# Patient Record
Sex: Female | Born: 1985
Health system: Southern US, Community
[De-identification: ages and names within clinical notes are randomized; demographics above are authoritative.]

## PROBLEM LIST (undated history)

## (undated) DIAGNOSIS — Z803 Family history of malignant neoplasm of breast: Secondary | ICD-10-CM

## (undated) HISTORY — PX: WISDOM TOOTH EXTRACTION: SHX21

## (undated) HISTORY — PX: NO PAST SURGERIES: SHX2092

## (undated) HISTORY — DX: Family history of malignant neoplasm of breast: Z80.3

---

## 2014-06-02 NOTE — L&D Delivery Note (Signed)
Delivery Note Patient presented on 03/26/15 with PROM of clear fluid at 0230. Labor was augmented with pitocin, and she progressed to completion at 1859. She had a prolonged second stage of labor, laboring down in different positions and pushing for over three hours.  At 3:19 AM a viable and healthy female was delivered via Vaginal, Spontaneous Delivery (Presentation: Middle Occiput Anterior).  APGAR: 8, 9; weight pending.   Placenta status: Intact, Spontaneous. Cord: 3 vessels with the following complications: None.  Cord pH: N/A  Anesthesia: Epidural  Episiotomy: None Lacerations: 2nd degree;Perineal Suture Repair: 3.0 vicryl Est. Blood Loss (mL): 125  Mom to postpartum.  Baby to Couplet care / Skin to Skin.  Jamelle HaringHillary M Fitzgerald, MD [facility], PGY-1 03/27/2015, 4:06 AM  OB fellow attestation: Patient is a G1P0000 at 5038w6d who was admitted for PROM. She progressed with augmentation via pitocin. She had a protracted second stage. She pushed for 3 hours with prolonged crowning.  I was gloved and present for delivery in its entirety. After delivery of the infant's head, a shoulder dystocia was identified and alleviated with Mcroberts, Suprapubic pressure and wood screw which were successful.   Complications: none  Lacerations: 2nd degree  EBL: 125  Federico FlakeKimberly Niles Livy Ross, MD 5:23 AM

## 2014-09-07 LAB — OB RESULTS CONSOLE HGB/HCT, BLOOD
HCT: 39 %
HCT: 39 %
Hemoglobin: 12 g/dL
Hemoglobin: 13.4 g/dL
Hemoglobin: 13.4 g/dL

## 2014-09-07 LAB — OB RESULTS CONSOLE ANTIBODY SCREEN: ANTIBODY SCREEN: NEGATIVE

## 2014-09-07 LAB — OB RESULTS CONSOLE RPR
RPR: NONREACTIVE
RPR: NONREACTIVE
RPR: NONREACTIVE
RPR: REACTIVE

## 2014-09-07 LAB — OB RESULTS CONSOLE HIV ANTIBODY (ROUTINE TESTING)
HIV: NONREACTIVE
HIV: NONREACTIVE
HIV: NONREACTIVE

## 2014-09-07 LAB — OB RESULTS CONSOLE PLATELET COUNT: Platelets: 376 10*3/uL

## 2014-09-07 LAB — OB RESULTS CONSOLE HEPATITIS B SURFACE ANTIGEN: HEP B S AG: NEGATIVE

## 2014-09-07 LAB — OB RESULTS CONSOLE ABO/RH: RH Type: POSITIVE

## 2014-09-07 LAB — OB RESULTS CONSOLE RUBELLA ANTIBODY, IGM: RUBELLA: IMMUNE

## 2014-11-27 ENCOUNTER — Ambulatory Visit (INDEPENDENT_AMBULATORY_CARE_PROVIDER_SITE_OTHER): Payer: BC Managed Care – PPO | Admitting: Obstetrics and Gynecology

## 2014-11-27 ENCOUNTER — Encounter: Payer: Self-pay | Admitting: Obstetrics and Gynecology

## 2014-11-27 VITALS — BP 116/79 | HR 97 | Ht 62.0 in | Wt 144.0 lb

## 2014-11-27 DIAGNOSIS — Z8659 Personal history of other mental and behavioral disorders: Secondary | ICD-10-CM

## 2014-11-27 DIAGNOSIS — Z3400 Encounter for supervision of normal first pregnancy, unspecified trimester: Secondary | ICD-10-CM | POA: Insufficient documentation

## 2014-11-27 DIAGNOSIS — Z3402 Encounter for supervision of normal first pregnancy, second trimester: Secondary | ICD-10-CM

## 2014-11-27 NOTE — Progress Notes (Signed)
   Subjective:    Tabitha Rose is a G1P0000 [redacted]w[redacted]d being seen today for her first obstetrical visit. She recently moved to [place] and started her care in [place]. Her obstetrical history is significant for first pregnancy and h/o depression currently not on any medications. Patient does intend to breast feed. Pregnancy history fully reviewed.  Patient reports no complaints.  Filed Vitals:   11/27/14 1450 11/27/14 1452  BP: 116/79   Pulse: 97   Height:  5\' 2"  (1.575 m)  Weight: 144 lb (65.318 kg)     HISTORY: OB History  Gravida Para Term Preterm AB SAB TAB Ectopic Multiple Living  1 0 0 0 0 0 0 0 0 0     # Outcome Date GA Lbr Len/2nd Weight Sex Delivery Anes PTL Lv  1 Current              History reviewed. No pertinent past medical history. Past Surgical History  Procedure Laterality Date  . Wisdom tooth extraction     Family History  Problem Relation Age of Onset  . Breast cancer Mother 25     Exam    Uterus:   24 cm      Assessment:    Pregnancy: G1P0000 Patient Active Problem List   Diagnosis Date Noted  . Supervision of normal first pregnancy, antepartum 11/27/2014  . History of depression 11/27/2014        Plan:     Initial labs drawn. Prenatal vitamins. Problem list reviewed and updated. Genetic Screening discussed First Screen: results reviewed.  Ultrasound discussed; fetal survey: results reviewed. 1 hour GCT and labs today  Follow up in 4 weeks. 50% of 30 min visit spent on counseling and coordination of care.     Kielyn Kardell 11/27/2014

## 2014-11-27 NOTE — Patient Instructions (Signed)
Second Trimester of Pregnancy The second trimester is from week 13 through week 28, months 4 through 6. The second trimester is often a time when you feel your best. Your body has also adjusted to being pregnant, and you begin to feel better physically. Usually, morning sickness has lessened or quit completely, you may have more energy, and you may have an increase in appetite. The second trimester is also a time when the fetus is growing rapidly. At the end of the sixth month, the fetus is about 9 inches long and weighs about 1 pounds. You will likely begin to feel the baby move (quickening) between 18 and 20 weeks of the pregnancy. BODY CHANGES Your body goes through many changes during pregnancy. The changes vary from woman to woman.   Your weight will continue to increase. You will notice your lower abdomen bulging out.  You may begin to get stretch marks on your hips, abdomen, and breasts.  You may develop headaches that can be relieved by medicines approved by your health care provider.  You may urinate more often because the fetus is pressing on your bladder.  You may develop or continue to have heartburn as a result of your pregnancy.  You may develop constipation because certain hormones are causing the muscles that push waste through your intestines to slow down.  You may develop hemorrhoids or swollen, bulging veins (varicose veins).  You may have back pain because of the weight gain and pregnancy hormones relaxing your joints between the bones in your pelvis and as a result of a shift in weight and the muscles that support your balance.  Your breasts will continue to grow and be tender.  Your gums may bleed and may be sensitive to brushing and flossing.  Dark spots or blotches (chloasma, mask of pregnancy) may develop on your face. This will likely fade after the baby is born.  A dark line from your belly button to the pubic area (linea nigra) may appear. This will likely  fade after the baby is born.  You may have changes in your hair. These can include thickening of your hair, rapid growth, and changes in texture. Some women also have hair loss during or after pregnancy, or hair that feels dry or thin. Your hair will most likely return to normal after your baby is born. WHAT TO EXPECT AT YOUR PRENATAL VISITS During a routine prenatal visit:  You will be weighed to make sure you and the fetus are growing normally.  Your blood pressure will be taken.  Your abdomen will be measured to track your baby's growth.  The fetal heartbeat will be listened to.  Any test results from the previous visit will be discussed. Your health care provider may ask you:  How you are feeling.  If you are feeling the baby move.  If you have had any abnormal symptoms, such as leaking fluid, bleeding, severe headaches, or abdominal cramping.  If you have any questions. Other tests that may be performed during your second trimester include:  Blood tests that check for:  Low iron levels (anemia).  Gestational diabetes (between 24 and 28 weeks).  Rh antibodies.  Urine tests to check for infections, diabetes, or protein in the urine.  An ultrasound to confirm the proper growth and development of the baby.  An amniocentesis to check for possible genetic problems.  Fetal screens for spina bifida and Down syndrome. HOME CARE INSTRUCTIONS   Avoid all smoking, herbs, alcohol, and unprescribed   drugs. These chemicals affect the formation and growth of the baby.  Follow your health care provider's instructions regarding medicine use. There are medicines that are either safe or unsafe to take during pregnancy.  Exercise only as directed by your health care provider. Experiencing uterine cramps is a good sign to stop exercising.  Continue to eat regular, healthy meals.  Wear a good support bra for breast tenderness.  Do not use hot tubs, steam rooms, or saunas.  Wear  your seat belt at all times when driving.  Avoid raw meat, uncooked cheese, cat litter boxes, and soil used by cats. These carry germs that can cause birth defects in the baby.  Take your prenatal vitamins.  Try taking a stool softener (if your health care provider approves) if you develop constipation. Eat more high-fiber foods, such as fresh vegetables or fruit and whole grains. Drink plenty of fluids to keep your urine clear or pale yellow.  Take warm sitz baths to soothe any pain or discomfort caused by hemorrhoids. Use hemorrhoid cream if your health care provider approves.  If you develop varicose veins, wear support hose. Elevate your feet for 15 minutes, 3-4 times a day. Limit salt in your diet.  Avoid heavy lifting, wear low heel shoes, and practice good posture.  Rest with your legs elevated if you have leg cramps or low back pain.  Visit your dentist if you have not gone yet during your pregnancy. Use a soft toothbrush to brush your teeth and be gentle when you floss.  A sexual relationship may be continued unless your health care provider directs you otherwise.  Continue to go to all your prenatal visits as directed by your health care provider. SEEK MEDICAL CARE IF:   You have dizziness.  You have mild pelvic cramps, pelvic pressure, or nagging pain in the abdominal area.  You have persistent nausea, vomiting, or diarrhea.  You have a bad smelling vaginal discharge.  You have pain with urination. SEEK IMMEDIATE MEDICAL CARE IF:   You have a fever.  You are leaking fluid from your vagina.  You have spotting or bleeding from your vagina.  You have severe abdominal cramping or pain.  You have rapid weight gain or loss.  You have shortness of breath with chest pain.  You notice sudden or extreme swelling of your face, hands, ankles, feet, or legs.  You have not felt your baby move in over an hour.  You have severe headaches that do not go away with  medicine.  You have vision changes. Document Released: 05/13/2001 Document Revised: 05/24/2013 Document Reviewed: 07/20/2012 [employer] Patient Information 2015 [employer]. This information is not intended to replace advice given to you by your health care provider. Make sure you discuss any questions you have with your health care provider.  Contraception Choices Contraception (birth control) is the use of any methods or devices to prevent pregnancy. Below are some methods to help avoid pregnancy. HORMONAL METHODS   Contraceptive implant. This is a thin, plastic tube containing progesterone hormone. It does not contain estrogen hormone. Your health care provider inserts the tube in the inner part of the upper arm. The tube can remain in place for up to 3 years. After 3 years, the implant must be removed. The implant prevents the ovaries from releasing an egg (ovulation), thickens the cervical mucus to prevent sperm from entering the uterus, and thins the lining of the inside of the uterus.  Progesterone-only injections. These injections are given   every 3 months by your health care provider to prevent pregnancy. This synthetic progesterone hormone stops the ovaries from releasing eggs. It also thickens cervical mucus and changes the uterine lining. This makes it harder for sperm to survive in the uterus.  Birth control pills. These pills contain estrogen and progesterone hormone. They work by preventing the ovaries from releasing eggs (ovulation). They also cause the cervical mucus to thicken, preventing the sperm from entering the uterus. Birth control pills are prescribed by a health care provider.Birth control pills can also be used to treat heavy periods.  Minipill. This type of birth control pill contains only the progesterone hormone. They are taken every day of each month and must be prescribed by your health care provider.  Birth control patch. The patch contains hormones similar to  those in birth control pills. It must be changed once a week and is prescribed by a health care provider.  Vaginal ring. The ring contains hormones similar to those in birth control pills. It is left in the vagina for 3 weeks, removed for 1 week, and then a new one is put back in place. The patient must be comfortable inserting and removing the ring from the vagina.A health care provider's prescription is necessary.  Emergency contraception. Emergency contraceptives prevent pregnancy after unprotected sexual intercourse. This pill can be taken right after sex or up to 5 days after unprotected sex. It is most effective the sooner you take the pills after having sexual intercourse. Most emergency contraceptive pills are available without a prescription. Check with your pharmacist. Do not use emergency contraception as your only form of birth control. BARRIER METHODS   Female condom. This is a thin sheath (latex or rubber) that is worn over the penis during sexual intercourse. It can be used with spermicide to increase effectiveness.  Female condom. This is a soft, loose-fitting sheath that is put into the vagina before sexual intercourse.  Diaphragm. This is a soft, latex, dome-shaped barrier that must be fitted by a health care provider. It is inserted into the vagina, along with a spermicidal jelly. It is inserted before intercourse. The diaphragm should be left in the vagina for 6 to 8 hours after intercourse.  Cervical cap. This is a round, soft, latex or plastic cup that fits over the cervix and must be fitted by a health care provider. The cap can be left in place for up to 48 hours after intercourse.  Sponge. This is a soft, circular piece of polyurethane foam. The sponge has spermicide in it. It is inserted into the vagina after wetting it and before sexual intercourse.  Spermicides. These are chemicals that kill or block sperm from entering the cervix and uterus. They come in the form of  creams, jellies, suppositories, foam, or tablets. They do not require a prescription. They are inserted into the vagina with an applicator before having sexual intercourse. The process must be repeated every time you have sexual intercourse. INTRAUTERINE CONTRACEPTION  Intrauterine device (IUD). This is a T-shaped device that is put in a woman's uterus during a menstrual period to prevent pregnancy. There are 2 types:  Copper IUD. This type of IUD is wrapped in copper wire and is placed inside the uterus. Copper makes the uterus and fallopian tubes produce a fluid that kills sperm. It can stay in place for 10 years.  Hormone IUD. This type of IUD contains the hormone progestin (synthetic progesterone). The hormone thickens the cervical mucus and prevents sperm from   entering the uterus, and it also thins the uterine lining to prevent implantation of a fertilized egg. The hormone can weaken or kill the sperm that get into the uterus. It can stay in place for 3-5 years, depending on which type of IUD is used. PERMANENT METHODS OF CONTRACEPTION  Female tubal ligation. This is when the woman's fallopian tubes are surgically sealed, tied, or blocked to prevent the egg from traveling to the uterus.  Hysteroscopic sterilization. This involves placing a small coil or insert into each fallopian tube. Your doctor uses a technique called hysteroscopy to do the procedure. The device causes scar tissue to form. This results in permanent blockage of the fallopian tubes, so the sperm cannot fertilize the egg. It takes about 3 months after the procedure for the tubes to become blocked. You must use another form of birth control for these 3 months.  Female sterilization. This is when the female has the tubes that carry sperm tied off (vasectomy).This blocks sperm from entering the vagina during sexual intercourse. After the procedure, the man can still ejaculate fluid (semen). NATURAL PLANNING METHODS  Natural family  planning. This is not having sexual intercourse or using a barrier method (condom, diaphragm, cervical cap) on days the woman could become pregnant.  Calendar method. This is keeping track of the length of each menstrual cycle and identifying when you are fertile.  Ovulation method. This is avoiding sexual intercourse during ovulation.  Symptothermal method. This is avoiding sexual intercourse during ovulation, using a thermometer and ovulation symptoms.  Post-ovulation method. This is timing sexual intercourse after you have ovulated. Regardless of which type or method of contraception you choose, it is important that you use condoms to protect against the transmission of sexually transmitted infections (STIs). Talk with your health care provider about which form of contraception is most appropriate for you. Document Released: 05/19/2005 Document Revised: 05/24/2013 Document Reviewed: 11/11/2012 [employer] Patient Information 2015 [employer]. This information is not intended to replace advice given to you by your health care provider. Make sure you discuss any questions you have with your health care provider.  Postpartum Depression and Baby Blues The postpartum period begins right after the birth of a baby. During this time, there is often a great amount of joy and excitement. It is also a time of many changes in the life of the parents. Regardless of how many times a mother gives birth, each child brings new challenges and dynamics to the family. It is not unusual to have feelings of excitement along with confusing shifts in moods, emotions, and thoughts. All mothers are at risk of developing postpartum depression or the "baby blues." These mood changes can occur right after giving birth, or they may occur many months after giving birth. The baby blues or postpartum depression can be mild or severe. Additionally, postpartum depression can go away rather quickly, or it can be a long-term  condition.  CAUSES Raised hormone levels and the rapid drop in those levels are thought to be a main cause of postpartum depression and the baby blues. A number of hormones change during and after pregnancy. Estrogen and progesterone usually decrease right after the delivery of your baby. The levels of thyroid hormone and various cortisol steroids also rapidly drop. Other factors that play a role in these mood changes include major life events and genetics.  RISK FACTORS If you have any of the following risks for the baby blues or postpartum depression, know what symptoms to watch out   for during the postpartum period. Risk factors that may increase the likelihood of getting the baby blues or postpartum depression include:  Having a personal or family history of depression.   Having depression while being pregnant.   Having premenstrual mood issues or mood issues related to oral contraceptives.  Having a lot of life stress.   Having marital conflict.   Lacking a social support network.   Having a baby with special needs.   Having health problems, such as diabetes.  SIGNS AND SYMPTOMS Symptoms of baby blues include:  Brief changes in mood, such as going from extreme happiness to sadness.  Decreased concentration.   Difficulty sleeping.   Crying spells, tearfulness.   Irritability.   Anxiety.  Symptoms of postpartum depression typically begin within the first month after giving birth. These symptoms include:  Difficulty sleeping or excessive sleepiness.   Marked weight loss.   Agitation.   Feelings of worthlessness.   Lack of interest in activity or food.  Postpartum psychosis is a very serious condition and can be dangerous. Fortunately, it is rare. Displaying any of the following symptoms is cause for immediate medical attention. Symptoms of postpartum psychosis include:   Hallucinations and delusions.   Bizarre or disorganized behavior.    Confusion or disorientation.  DIAGNOSIS  A diagnosis is made by an evaluation of your symptoms. There are no medical or lab tests that lead to a diagnosis, but there are various questionnaires that a health care provider may use to identify those with the baby blues, postpartum depression, or psychosis. Often, a screening tool called the Edinburgh Postnatal Depression Scale is used to diagnose depression in the postpartum period.  TREATMENT The baby blues usually goes away on its own in 1-2 weeks. Social support is often all that is needed. You will be encouraged to get adequate sleep and rest. Occasionally, you may be given medicines to help you sleep.  Postpartum depression requires treatment because it can last several months or longer if it is not treated. Treatment may include individual or group therapy, medicine, or both to address any social, physiological, and psychological factors that may play a role in the depression. Regular exercise, a healthy diet, rest, and social support may also be strongly recommended.  Postpartum psychosis is more serious and needs treatment right away. Hospitalization is often needed. HOME CARE INSTRUCTIONS  Get as much rest as you can. Nap when the baby sleeps.   Exercise regularly. Some women find yoga and walking to be beneficial.   Eat a balanced and nourishing diet.   Do little things that you enjoy. Have a cup of tea, take a bubble bath, read your favorite magazine, or listen to your favorite music.  Avoid alcohol.   Ask for help with household chores, cooking, grocery shopping, or running errands as needed. Do not try to do everything.   Talk to people close to you about how you are feeling. Get support from your partner, family members, friends, or other new moms.  Try to stay positive in how you think. Think about the things you are grateful for.   Do not spend a lot of time alone.   Only take over-the-counter or prescription  medicine as directed by your health care provider.  Keep all your postpartum appointments.   Let your health care provider know if you have any concerns.  SEEK MEDICAL CARE IF: You are having a reaction to or problems with your medicine. SEEK IMMEDIATE MEDICAL CARE IF:    You have suicidal feelings.   You think you may harm the baby or someone else. MAKE SURE YOU:  Understand these instructions.  Will watch your condition.  Will get help right away if you are not doing well or get worse. Document Released: 02/21/2004 Document Revised: 05/24/2013 Document Reviewed: 02/28/2013 [employer] Patient Information 2015 [employer]. This information is not intended to replace advice given to you by your health care provider. Make sure you discuss any questions you have with your health care provider.  Breastfeeding Deciding to breastfeed is one of the best choices you can make for you and your baby. A change in hormones during pregnancy causes your breast tissue to grow and increases the number and size of your milk ducts. These hormones also allow proteins, sugars, and fats from your blood supply to make breast milk in your milk-producing glands. Hormones prevent breast milk from being released before your baby is born as well as prompt milk flow after birth. Once breastfeeding has begun, thoughts of your baby, as well as his or her sucking or crying, can stimulate the release of milk from your milk-producing glands.  BENEFITS OF BREASTFEEDING For Your Baby  Your first milk (colostrum) helps your baby's digestive system function better.   There are antibodies in your milk that help your baby fight off infections.   Your baby has a lower incidence of asthma, allergies, and sudden infant death syndrome.   The nutrients in breast milk are better for your baby than infant formulas and are designed uniquely for your baby's needs.   Breast milk improves your baby's brain development.    Your baby is less likely to develop other conditions, such as childhood obesity, asthma, or type 2 diabetes mellitus.  For You   Breastfeeding helps to create a very special bond between you and your baby.   Breastfeeding is convenient. Breast milk is always available at the correct temperature and costs nothing.   Breastfeeding helps to burn calories and helps you lose the weight gained during pregnancy.   Breastfeeding makes your uterus contract to its prepregnancy size faster and slows bleeding (lochia) after you give birth.   Breastfeeding helps to lower your risk of developing type 2 diabetes mellitus, osteoporosis, and breast or ovarian cancer later in life. SIGNS THAT YOUR BABY IS HUNGRY Early Signs of Hunger  Increased alertness or activity.  Stretching.  Movement of the head from side to side.  Movement of the head and opening of the mouth when the corner of the mouth or cheek is stroked (rooting).  Increased sucking sounds, smacking lips, cooing, sighing, or squeaking.  Hand-to-mouth movements.  Increased sucking of fingers or hands. Late Signs of Hunger  Fussing.  Intermittent crying. Extreme Signs of Hunger Signs of extreme hunger will require calming and consoling before your baby will be able to breastfeed successfully. Do not wait for the following signs of extreme hunger to occur before you initiate breastfeeding:   Restlessness.  A loud, strong cry.   Screaming. BREASTFEEDING BASICS Breastfeeding Initiation  Find a comfortable place to sit or lie down, with your neck and back well supported.  Place a pillow or rolled up blanket under your baby to bring him or her to the level of your breast (if you are seated). Nursing pillows are specially designed to help support your arms and your baby while you breastfeed.  Make sure that your baby's abdomen is facing your abdomen.   Gently massage your breast. With your   fingertips, massage from your  chest wall toward your nipple in a circular motion. This encourages milk flow. You may need to continue this action during the feeding if your milk flows slowly.  Support your breast with 4 fingers underneath and your thumb above your nipple. Make sure your fingers are well away from your nipple and your baby's mouth.   Stroke your baby's lips gently with your finger or nipple.   When your baby's mouth is open wide enough, quickly bring your baby to your breast, placing your entire nipple and as much of the colored area around your nipple (areola) as possible into your baby's mouth.   More areola should be visible above your baby's upper lip than below the lower lip.   Your baby's tongue should be between his or her lower gum and your breast.   Ensure that your baby's mouth is correctly positioned around your nipple (latched). Your baby's lips should create a seal on your breast and be turned out (everted).  It is common for your baby to suck about 2-3 minutes in order to start the flow of breast milk. Latching Teaching your baby how to latch on to your breast properly is very important. An improper latch can cause nipple pain and decreased milk supply for you and poor weight gain in your baby. Also, if your baby is not latched onto your nipple properly, he or she may swallow some air during feeding. This can make your baby fussy. Burping your baby when you switch breasts during the feeding can help to get rid of the air. However, teaching your baby to latch on properly is still the best way to prevent fussiness from swallowing air while breastfeeding. Signs that your baby has successfully latched on to your nipple:    Silent tugging or silent sucking, without causing you pain.   Swallowing heard between every 3-4 sucks.    Muscle movement above and in front of his or her ears while sucking.  Signs that your baby has not successfully latched on to nipple:   Sucking sounds or  smacking sounds from your baby while breastfeeding.  Nipple pain. If you think your baby has not latched on correctly, slip your finger into the corner of your baby's mouth to break the suction and place it between your baby's gums. Attempt breastfeeding initiation again. Signs of Successful Breastfeeding Signs from your baby:   A gradual decrease in the number of sucks or complete cessation of sucking.   Falling asleep.   Relaxation of his or her body.   Retention of a small amount of milk in his or her mouth.   Letting go of your breast by himself or herself. Signs from you:  Breasts that have increased in firmness, weight, and size 1-3 hours after feeding.   Breasts that are softer immediately after breastfeeding.  Increased milk volume, as well as a change in milk consistency and color by the fifth day of breastfeeding.   Nipples that are not sore, cracked, or bleeding. Signs That Your Baby is Getting Enough Milk  Wetting at least 3 diapers in a 24-hour period. The urine should be clear and pale yellow by age 5 days.  At least 3 stools in a 24-hour period by age 5 days. The stool should be soft and yellow.  At least 3 stools in a 24-hour period by age 7 days. The stool should be seedy and yellow.  No loss of weight greater than 10% of birth   weight during the first 3 days of age.  Average weight gain of 4-7 ounces (113-198 g) per week after age 4 days.  Consistent daily weight gain by age 5 days, without weight loss after the age of 2 weeks. After a feeding, your baby may spit up a small amount. This is common. BREASTFEEDING FREQUENCY AND DURATION Frequent feeding will help you make more milk and can prevent sore nipples and breast engorgement. Breastfeed when you feel the need to reduce the fullness of your breasts or when your baby shows signs of hunger. This is called "breastfeeding on demand." Avoid introducing a pacifier to your baby while you are working to  establish breastfeeding (the first 4-6 weeks after your baby is born). After this time you may choose to use a pacifier. Research has shown that pacifier use during the first year of a baby's life decreases the risk of sudden infant death syndrome (SIDS). Allow your baby to feed on each breast as long as he or she wants. Breastfeed until your baby is finished feeding. When your baby unlatches or falls asleep while feeding from the first breast, offer the second breast. Because newborns are often sleepy in the first few weeks of life, you may need to awaken your baby to get him or her to feed. Breastfeeding times will vary from baby to baby. However, the following rules can serve as a guide to help you ensure that your baby is properly fed:  Newborns (babies 4 weeks of age or younger) may breastfeed every 1-3 hours.  Newborns should not go longer than 3 hours during the day or 5 hours during the night without breastfeeding.  You should breastfeed your baby a minimum of 8 times in a 24-hour period until you begin to introduce solid foods to your baby at around 6 months of age. BREAST MILK PUMPING Pumping and storing breast milk allows you to ensure that your baby is exclusively fed your breast milk, even at times when you are unable to breastfeed. This is especially important if you are going back to work while you are still breastfeeding or when you are not able to be present during feedings. Your lactation consultant can give you guidelines on how long it is safe to store breast milk.  A breast pump is a machine that allows you to pump milk from your breast into a sterile bottle. The pumped breast milk can then be stored in a refrigerator or freezer. Some breast pumps are operated by hand, while others use electricity. Ask your lactation consultant which type will work best for you. Breast pumps can be purchased, but some hospitals and breastfeeding support groups lease breast pumps on a monthly basis. A  lactation consultant can teach you how to hand express breast milk, if you prefer not to use a pump.  CARING FOR YOUR BREASTS WHILE YOU BREASTFEED Nipples can become dry, cracked, and sore while breastfeeding. The following recommendations can help keep your breasts moisturized and healthy:  Avoid using soap on your nipples.   Wear a supportive bra. Although not required, special nursing bras and tank tops are designed to allow access to your breasts for breastfeeding without taking off your entire bra or top. Avoid wearing underwire-style bras or extremely tight bras.  Air dry your nipples for 3-4minutes after each feeding.   Use only cotton bra pads to absorb leaked breast milk. Leaking of breast milk between feedings is normal.   Use lanolin on your nipples after breastfeeding.   Lanolin helps to maintain your skin's normal moisture barrier. If you use pure lanolin, you do not need to wash it off before feeding your baby again. Pure lanolin is not toxic to your baby. You may also hand express a few drops of breast milk and gently massage that milk into your nipples and allow the milk to air dry. In the first few weeks after giving birth, some women experience extremely full breasts (engorgement). Engorgement can make your breasts feel heavy, warm, and tender to the touch. Engorgement peaks within 3-5 days after you give birth. The following recommendations can help ease engorgement:  Completely empty your breasts while breastfeeding or pumping. You may want to start by applying warm, moist heat (in the shower or with warm water-soaked hand towels) just before feeding or pumping. This increases circulation and helps the milk flow. If your baby does not completely empty your breasts while breastfeeding, pump any extra milk after he or she is finished.  Wear a snug bra (nursing or regular) or tank top for 1-2 days to signal your body to slightly decrease milk production.  Apply ice packs to your  breasts, unless this is too uncomfortable for you.  Make sure that your baby is latched on and positioned properly while breastfeeding. If engorgement persists after 48 hours of following these recommendations, contact your health care provider or a lactation consultant. OVERALL HEALTH CARE RECOMMENDATIONS WHILE BREASTFEEDING  Eat healthy foods. Alternate between meals and snacks, eating 3 of each per day. Because what you eat affects your breast milk, some of the foods may make your baby more irritable than usual. Avoid eating these foods if you are sure that they are negatively affecting your baby.  Drink milk, fruit juice, and water to satisfy your thirst (about 10 glasses a day).   Rest often, relax, and continue to take your prenatal vitamins to prevent fatigue, stress, and anemia.  Continue breast self-awareness checks.  Avoid chewing and smoking tobacco.  Avoid alcohol and drug use. Some medicines that may be harmful to your baby can pass through breast milk. It is important to ask your health care provider before taking any medicine, including all over-the-counter and prescription medicine as well as vitamin and herbal supplements. It is possible to become pregnant while breastfeeding. If birth control is desired, ask your health care provider about options that will be safe for your baby. SEEK MEDICAL CARE IF:   You feel like you want to stop breastfeeding or have become frustrated with breastfeeding.  You have painful breasts or nipples.  Your nipples are cracked or bleeding.  Your breasts are red, tender, or warm.  You have a swollen area on either breast.  You have a fever or chills.  You have nausea or vomiting.  You have drainage other than breast milk from your nipples.  Your breasts do not become full before feedings by the fifth day after you give birth.  You feel sad and depressed.  Your baby is too sleepy to eat well.  Your baby is having trouble  sleeping.   Your baby is wetting less than 3 diapers in a 24-hour period.  Your baby has less than 3 stools in a 24-hour period.  Your baby's skin or the white part of his or her eyes becomes yellow.   Your baby is not gaining weight by 5 days of age. SEEK IMMEDIATE MEDICAL CARE IF:   Your baby is overly tired (lethargic) and does not want to wake   up and feed.  Your baby develops an unexplained fever. Document Released: 05/19/2005 Document Revised: 05/24/2013 Document Reviewed: 11/10/2012 [employer] Patient Information 2015 [employer]. This information is not intended to replace advice given to you by your health care provider. Make sure you discuss any questions you have with your health care provider.  

## 2014-12-25 ENCOUNTER — Other Ambulatory Visit: Payer: Self-pay | Admitting: Obstetrics & Gynecology

## 2014-12-25 ENCOUNTER — Ambulatory Visit (INDEPENDENT_AMBULATORY_CARE_PROVIDER_SITE_OTHER): Payer: BC Managed Care – PPO | Admitting: Obstetrics & Gynecology

## 2014-12-25 ENCOUNTER — Encounter: Payer: Self-pay | Admitting: *Deleted

## 2014-12-25 VITALS — BP 113/72 | HR 109 | Wt 150.0 lb

## 2014-12-25 DIAGNOSIS — Z36 Encounter for antenatal screening of mother: Secondary | ICD-10-CM | POA: Diagnosis not present

## 2014-12-25 DIAGNOSIS — Z3403 Encounter for supervision of normal first pregnancy, third trimester: Secondary | ICD-10-CM

## 2014-12-25 DIAGNOSIS — Z23 Encounter for immunization: Secondary | ICD-10-CM | POA: Diagnosis not present

## 2014-12-25 LAB — CBC
HCT: 34.3 % — ABNORMAL LOW (ref 36.0–46.0)
HEMOGLOBIN: 12 g/dL (ref 12.0–15.0)
MCH: 31.1 pg (ref 26.0–34.0)
MCHC: 35 g/dL (ref 30.0–36.0)
MCV: 88.9 fL (ref 78.0–100.0)
MPV: 8.6 fL (ref 8.6–12.4)
Platelets: 303 10*3/uL (ref 150–400)
RBC: 3.86 MIL/uL — AB (ref 3.87–5.11)
RDW: 13.2 % (ref 11.5–15.5)
WBC: 9.9 10*3/uL (ref 4.0–10.5)

## 2014-12-25 NOTE — Patient Instructions (Signed)
Return to clinic for any obstetric concerns or go to [facility] for evaluation  

## 2014-12-25 NOTE — Progress Notes (Signed)
Subjective:  Tabitha Rose is a 29 y.o. G1P0000 at [redacted]w[redacted]d being seen today for ongoing prenatal care.  Patient reports no complaints.  Contractions: Not present.  Vag. Bleeding: None. Movement: Present. Denies leaking of fluid.   The following portions of the patient's history were reviewed and updated as appropriate: allergies, current medications, past family history, past medical history, past social history, past surgical history and problem list.   Objective:   Filed Vitals:   12/25/14 1346  BP: 113/72  Pulse: 109  Weight: 150 lb (68.04 kg)    Fetal Status: Fetal Heart Rate (bpm): 135 Fundal Height: 27 cm Movement: Present     General:  Alert, oriented and cooperative. Patient is in no acute distress.  Skin: Skin is warm and dry. No rash noted.   Cardiovascular: Normal heart rate noted  Respiratory: Normal respiratory effort, no problems with respiration noted  Abdomen: Soft, gravid, appropriate for gestational age. Pain/Pressure: Absent     Vaginal: Vag. Bleeding: None.    Vag D/C Character: Thin  Cervix: Not evaluated        Extremities: Normal range of motion.  Edema: None  Mental Status: Normal mood and affect. Normal behavior. Normal judgment and thought content.   Urinalysis: Urine Protein: Negative Urine Glucose: Negative  Assessment and Plan:  Pregnancy: G1P0000 at [redacted]w[redacted]d  1. Supervision of normal first pregnancy, antepartum, third trimester Third trimester labs, Tdap today; will follow up results and manage accordingly. - Glucose Tolerance, 1 HR (50g) - CBC - RPR - HIV antibody - Tdap vaccine greater than or equal to 7yo IM - ABO Preterm labor symptoms and general obstetric precautions including but not limited to vaginal bleeding, contractions, leaking of fluid and fetal movement were reviewed in detail with the patient. Please refer to After Visit Summary for other counseling recommendations.  No Follow-up on file.   Tereso Newcomer, MD

## 2014-12-26 ENCOUNTER — Telehealth: Payer: Self-pay | Admitting: *Deleted

## 2014-12-26 LAB — HIV ANTIBODY (ROUTINE TESTING W REFLEX): HIV: NONREACTIVE

## 2014-12-26 LAB — GLUCOSE TOLERANCE, 1 HOUR (50G) W/O FASTING: GLUCOSE 1 HOUR GTT: 156 mg/dL — AB (ref 70–140)

## 2014-12-26 LAB — RPR

## 2014-12-26 LAB — ABO

## 2014-12-26 NOTE — Telephone Encounter (Signed)
-----   Message from Tereso Newcomer, MD sent at 12/26/2014 12:10 PM EDT ----- 1 hr GTT 156.  Please schedule for 3 hr GTT

## 2014-12-26 NOTE — Telephone Encounter (Signed)
Pt needs a 3HrGTT - LMOM for pt to rtn call to schedule.

## 2014-12-28 LAB — RH TYPE: RH TYPE: POSITIVE

## 2015-01-01 ENCOUNTER — Other Ambulatory Visit (INDEPENDENT_AMBULATORY_CARE_PROVIDER_SITE_OTHER): Payer: BC Managed Care – PPO | Admitting: *Deleted

## 2015-01-01 DIAGNOSIS — Z131 Encounter for screening for diabetes mellitus: Secondary | ICD-10-CM

## 2015-01-01 DIAGNOSIS — R7309 Other abnormal glucose: Secondary | ICD-10-CM

## 2015-01-01 DIAGNOSIS — R7302 Impaired glucose tolerance (oral): Secondary | ICD-10-CM

## 2015-01-01 NOTE — Progress Notes (Signed)
Patient here today for a 3 hr GTT.  

## 2015-01-02 LAB — GLUCOSE TOLERANCE, 3 HOURS
Glucose Tolerance, 1 hour: 131 mg/dL (ref 70–189)
Glucose Tolerance, 2 hour: 124 mg/dL (ref 70–164)
Glucose Tolerance, Fasting: 47 mg/dL — ABNORMAL LOW (ref 65–99)
Glucose, GTT - 3 Hour: 155 mg/dL — ABNORMAL HIGH (ref 70–144)

## 2015-01-09 ENCOUNTER — Encounter: Payer: Self-pay | Admitting: Obstetrics & Gynecology

## 2015-01-09 ENCOUNTER — Ambulatory Visit (INDEPENDENT_AMBULATORY_CARE_PROVIDER_SITE_OTHER): Payer: BC Managed Care – PPO | Admitting: Obstetrics & Gynecology

## 2015-01-09 VITALS — BP 100/67 | HR 105 | Wt 151.0 lb

## 2015-01-09 DIAGNOSIS — Z3403 Encounter for supervision of normal first pregnancy, third trimester: Secondary | ICD-10-CM

## 2015-01-09 MED ORDER — DOXYLAMINE-PYRIDOXINE 10-10 MG PO TBEC: 1.0000 | DELAYED_RELEASE_TABLET | Freq: Two times a day (BID) | ORAL | Status: DC

## 2015-01-09 NOTE — Progress Notes (Signed)
Pt c/o sudden sharp lower pains in her lower back on occasion, believes it could be related to sciatic nerve pain, needs refill on Diclegis for nausea.

## 2015-01-09 NOTE — Progress Notes (Signed)
Subjective:  Tabitha Rose is a 29 y.o. G1P0000 at [redacted]w[redacted]d being seen today for ongoing prenatal care.  Patient reports backache.  Contractions: Not present.  Vag. Bleeding: None. Movement: Present. Denies leaking of fluid.   The following portions of the patient's history were reviewed and updated as appropriate: allergies, current medications, past family history, past medical history, past social history, past surgical history and problem list.   Objective:   Filed Vitals:   01/09/15 1042  BP: 100/67  Pulse: 105  Weight: 151 lb (68.493 kg)    Fetal Status: Fetal Heart Rate (bpm): 146   Movement: Present     General:  Alert, oriented and cooperative. Patient is in no acute distress.  Skin: Skin is warm and dry. No rash noted.   Cardiovascular: Normal heart rate noted  Respiratory: Normal respiratory effort, no problems with respiration noted  Abdomen: Soft, gravid, appropriate for gestational age. Pain/Pressure: Absent     Pelvic: Vag. Bleeding: None Vag D/C Character: Thin   Cervical exam deferred        Extremities: Normal range of motion.  Edema: None  Mental Status: Normal mood and affect. Normal behavior. Normal judgment and thought content.   Urinalysis:      Assessment and Plan:  Pregnancy: G1P0000 at [redacted]w[redacted]d  1. Supervision of normal first pregnancy, antepartum, third trimester  - US OB Follow Up; Future for size greater than dates  Preterm labor symptoms and general obstetric precautions including but not limited to vaginal bleeding, contractions, leaking of fluid and fetal movement were reviewed in detail with the patient.  Please refer to After Visit Summary for other counseling recommendations.  Return in about 2 weeks (around 01/23/2015).   Allie Bossier, MD

## 2015-01-23 ENCOUNTER — Ambulatory Visit (HOSPITAL_COMMUNITY): Admission: RE | Admit: 2015-01-23 | Payer: BC Managed Care – PPO | Source: Ambulatory Visit

## 2015-01-23 ENCOUNTER — Ambulatory Visit (INDEPENDENT_AMBULATORY_CARE_PROVIDER_SITE_OTHER): Payer: BC Managed Care – PPO | Admitting: Obstetrics & Gynecology

## 2015-01-23 VITALS — BP 118/74 | HR 105 | Wt 154.0 lb

## 2015-01-23 DIAGNOSIS — R300 Dysuria: Secondary | ICD-10-CM | POA: Diagnosis not present

## 2015-01-23 DIAGNOSIS — O26893 Other specified pregnancy related conditions, third trimester: Secondary | ICD-10-CM

## 2015-01-23 DIAGNOSIS — Z3403 Encounter for supervision of normal first pregnancy, third trimester: Secondary | ICD-10-CM

## 2015-01-23 LAB — POCT URINALYSIS DIPSTICK
Bilirubin, UA: NEGATIVE
Blood, UA: NEGATIVE
GLUCOSE UA: NEGATIVE
KETONES UA: NEGATIVE
Leukocytes, UA: NEGATIVE
Nitrite, UA: NEGATIVE
PROTEIN UA: NEGATIVE
Spec Grav, UA: 1.01
Urobilinogen, UA: NEGATIVE
pH, UA: 5

## 2015-01-23 NOTE — Progress Notes (Signed)
Has a pressure around her urethra associated with baby's movement.  Has had pain with intercourse.

## 2015-01-23 NOTE — Progress Notes (Signed)
Subjective:  Tabitha Rose is a 29 y.o. G1P0000 at [redacted]w[redacted]d being seen today for ongoing prenatal care.  Patient reports some "urethral"pain with fetal movement..  Contractions: Not present.  Vag. Bleeding: None. Movement: Present. Denies leaking of fluid.   The following portions of the patient's history were reviewed and updated as appropriate: allergies, current medications, past family history, past medical history, past social history, past surgical history and problem list.   Objective:   Filed Vitals:   01/23/15 1022  BP: 118/74  Pulse: 105  Weight: 154 lb (69.854 kg)    Fetal Status: Fetal Heart Rate (bpm): 152   Movement: Present     General:  Alert, oriented and cooperative. Patient is in no acute distress.  Skin: Skin is warm and dry. No rash noted.   Cardiovascular: Normal heart rate noted  Respiratory: Normal respiratory effort, no problems with respiration noted  Abdomen: Soft, gravid, appropriate for gestational age. Pain/Pressure: Absent     Pelvic: Vag. Bleeding: None Vag D/C Character: Thin   Cervical exam deferred       Her urethra and EG all look normal.  Extremities: Normal range of motion.  Edema: None  Mental Status: Normal mood and affect. Normal behavior. Normal judgment and thought content.   Urinalysis: Urine Protein: Negative Urine Glucose: Negative  Assessment and Plan:  Pregnancy: G1P0000 at [redacted]w[redacted]d  1. Supervision of normal first pregnancy, antepartum, third trimester   2. Dysuria during pregnancy in third trimester  - POCT Urinalysis Dipstick  Preterm labor symptoms and general obstetric precautions including but not limited to vaginal bleeding, contractions, leaking of fluid and fetal movement were reviewed in detail with the patient.  Please refer to After Visit Summary for other counseling recommendations.  No Follow-up on file.   Allie Bossier, MD

## 2015-02-15 ENCOUNTER — Ambulatory Visit (INDEPENDENT_AMBULATORY_CARE_PROVIDER_SITE_OTHER): Payer: BC Managed Care – PPO | Admitting: Obstetrics & Gynecology

## 2015-02-15 VITALS — BP 120/75 | HR 99 | Wt 163.0 lb

## 2015-02-15 DIAGNOSIS — Z23 Encounter for immunization: Secondary | ICD-10-CM

## 2015-02-15 DIAGNOSIS — Z3403 Encounter for supervision of normal first pregnancy, third trimester: Secondary | ICD-10-CM

## 2015-02-15 NOTE — Progress Notes (Signed)
Subjective:  Tabitha Rose is a 29 y.o. G1P0000 at [redacted]w[redacted]d being seen today for ongoing prenatal care.  Patient reports no complaints.  Contractions: Irregular.  Vag. Bleeding: None. Movement: Present. Denies leaking of fluid.   The following portions of the patient's history were reviewed and updated as appropriate: allergies, current medications, past family history, past medical history, past social history, past surgical history and problem list.   Objective:   Filed Vitals:   02/15/15 0949  BP: 120/75  Pulse: 99  Weight: 163 lb (73.936 kg)    Fetal Status: Fetal Heart Rate (bpm): 134 Fundal Height: 37 cm Movement: Present     General:  Alert, oriented and cooperative. Patient is in no acute distress.  Skin: Skin is warm and dry. No rash noted.   Cardiovascular: Normal heart rate noted  Respiratory: Normal respiratory effort, no problems with respiration noted  Abdomen: Soft, gravid, appropriate for gestational age. Pain/Pressure: Present     Pelvic: Vag. Bleeding: None Vag D/C Character: Thin   Cervical exam deferred        Extremities: Normal range of motion.  Edema: Trace  Mental Status: Normal mood and affect. Normal behavior. Normal judgment and thought content.   Urinalysis: Urine Protein: Negative Urine Glucose: Negative  Assessment and Plan:  Pregnancy: G1P0000 at [redacted]w[redacted]d  1. Supervision of normal first pregnancy, antepartum, third trimester - Flu vaccine today - Cervical cultures at next visit  Preterm labor symptoms and general obstetric precautions including but not limited to vaginal bleeding, contractions, leaking of fluid and fetal movement were reviewed in detail with the patient. Please refer to After Visit Summary for other counseling recommendations.  Return in about 1 week (around 02/22/2015) for cervical cultures.   Allie Bossier, MD

## 2015-02-23 ENCOUNTER — Ambulatory Visit (INDEPENDENT_AMBULATORY_CARE_PROVIDER_SITE_OTHER): Payer: BC Managed Care – PPO | Admitting: Obstetrics & Gynecology

## 2015-02-23 VITALS — BP 113/75 | HR 82 | Wt 168.0 lb

## 2015-02-23 DIAGNOSIS — Z36 Encounter for antenatal screening of mother: Secondary | ICD-10-CM | POA: Diagnosis not present

## 2015-02-23 DIAGNOSIS — Z3403 Encounter for supervision of normal first pregnancy, third trimester: Secondary | ICD-10-CM

## 2015-02-23 LAB — OB RESULTS CONSOLE GC/CHLAMYDIA
CHLAMYDIA, DNA PROBE: NEGATIVE
GC PROBE AMP, GENITAL: NEGATIVE

## 2015-02-23 LAB — OB RESULTS CONSOLE GBS: GBS: POSITIVE

## 2015-02-23 NOTE — Patient Instructions (Signed)
Return to clinic for any obstetric concerns or go to [facility] for evaluation  

## 2015-02-23 NOTE — Progress Notes (Signed)
Subjective:  Tabitha Rose is a 29 y.o. G1P0000 at [redacted]w[redacted]d being seen today for ongoing prenatal care.  Patient reports no complaints.  Contractions: Irregular.  Vag. Bleeding: None. Movement: Present. Denies leaking of fluid.   The following portions of the patient's history were reviewed and updated as appropriate: allergies, current medications, past family history, past medical history, past social history, past surgical history and problem list.   Objective:   Filed Vitals:   02/23/15 1003  BP: 113/75  Pulse: 82  Weight: 168 lb (76.204 kg)    Fetal Status: Fetal Heart Rate (bpm): 150 Fundal Height: 37 cm Movement: Present  Presentation: Vertex  General:  Alert, oriented and cooperative. Patient is in no acute distress.  Skin: Skin is warm and dry. No rash noted.   Cardiovascular: Normal heart rate noted  Respiratory: Normal respiratory effort, no problems with respiration noted  Abdomen: Soft, gravid, appropriate for gestational age. Pain/Pressure: Present     Pelvic: Vag. Bleeding: None Vag D/C Character: Thin   Cervical exam performed Dilation: Closed Effacement (%): Thick Station: Ballotable  Extremities: Normal range of motion.  Edema: Trace  Mental Status: Normal mood and affect. Normal behavior. Normal judgment and thought content.   Urinalysis: Urine Protein: Trace Urine Glucose: Negative  Assessment and Plan:  Pregnancy: G1P0000 at [redacted]w[redacted]d  Supervision of normal first pregnancy, antepartum, third trimester Pelvic cultures done today - Culture, beta strep (group b only) - GC/Chlamydia Probe Amp Preterm labor symptoms and general obstetric precautions including but not limited to vaginal bleeding, contractions, leaking of fluid and fetal movement were reviewed in detail with the patient. Please refer to After Visit Summary for other counseling recommendations.  Return in about 1 week (around 03/02/2015) for OB Visit.   Tereso Newcomer, MD

## 2015-02-24 LAB — GC/CHLAMYDIA PROBE AMP
CT PROBE, AMP APTIMA: NEGATIVE
GC Probe RNA: NEGATIVE

## 2015-02-25 LAB — CULTURE, BETA STREP (GROUP B ONLY)

## 2015-03-02 ENCOUNTER — Ambulatory Visit (INDEPENDENT_AMBULATORY_CARE_PROVIDER_SITE_OTHER): Payer: BC Managed Care – PPO | Admitting: Obstetrics & Gynecology

## 2015-03-02 ENCOUNTER — Encounter: Payer: BC Managed Care – PPO | Admitting: Obstetrics & Gynecology

## 2015-03-02 VITALS — BP 122/79 | HR 88 | Wt 171.4 lb

## 2015-03-02 DIAGNOSIS — Z3403 Encounter for supervision of normal first pregnancy, third trimester: Secondary | ICD-10-CM

## 2015-03-02 NOTE — Progress Notes (Signed)
Subjective:  Tabitha Rose is a 29 y.o. G1P0000 at [redacted]w[redacted]d being seen today for ongoing prenatal care.  Patient reports no complaints.   .   .  . Denies leaking of fluid.   The following portions of the patient's history were reviewed and updated as appropriate: allergies, current medications, past family history, past medical history, past social history, past surgical history and problem list.   Objective:   Filed Vitals:   03/02/15 0946  BP: 122/79  Pulse: 88  Weight: 171 lb 6.4 oz (77.747 kg)    Fetal Status: Fetal Heart Rate (bpm): 154 Fundal Height: 38 cm    Presentation: Vertex  General:  Alert, oriented and cooperative. Patient is in no acute distress.  Skin: Skin is warm and dry. No rash noted.   Cardiovascular: Normal heart rate noted  Respiratory: Normal respiratory effort, no problems with respiration noted  Abdomen: Soft, gravid, appropriate for gestational age.       Pelvic:       Cervical exam deferred        Extremities: Normal range of motion.     Mental Status: Normal mood and affect. Normal behavior. Normal judgment and thought content.   Urinalysis:      Assessment and Plan:  Pregnancy: G1P0000 at [redacted]w[redacted]d  1. Supervision of normal first pregnancy, antepartum, third trimester   Term labor symptoms and general obstetric precautions including but not limited to vaginal bleeding, contractions, leaking of fluid and fetal movement were reviewed in detail with the patient. Please refer to After Visit Summary for other counseling recommendations.  Return in about 1 week (around 03/09/2015).   Allie Bossier, MD

## 2015-03-09 ENCOUNTER — Ambulatory Visit (INDEPENDENT_AMBULATORY_CARE_PROVIDER_SITE_OTHER): Payer: BC Managed Care – PPO | Admitting: Family Medicine

## 2015-03-09 VITALS — BP 138/85 | HR 93 | Wt 174.0 lb

## 2015-03-09 DIAGNOSIS — Z3403 Encounter for supervision of normal first pregnancy, third trimester: Secondary | ICD-10-CM

## 2015-03-09 NOTE — Progress Notes (Signed)
Pt c/o a few Braxton-Hicks contractions.  Declines headaches or visual changes.

## 2015-03-09 NOTE — Patient Instructions (Signed)
Third Trimester of Pregnancy The third trimester is from week 29 through week 42, months 7 through 9. The third trimester is a time when the fetus is growing rapidly. At the end of the ninth month, the fetus is about 20 inches in length and weighs 6-10 pounds.  BODY CHANGES Your body goes through many changes during pregnancy. The changes vary from woman to woman.   Your weight will continue to increase. You can expect to gain 25-35 pounds (11-16 kg) by the end of the pregnancy.  You may begin to get stretch marks on your hips, abdomen, and breasts.  You may urinate more often because the fetus is moving lower into your pelvis and pressing on your bladder.  You may develop or continue to have heartburn as a result of your pregnancy.  You may develop constipation because certain hormones are causing the muscles that push waste through your intestines to slow down.  You may develop hemorrhoids or swollen, bulging veins (varicose veins).  You may have pelvic pain because of the weight gain and pregnancy hormones relaxing your joints between the bones in your pelvis. Backaches may result from overexertion of the muscles supporting your posture.  You may have changes in your hair. These can include thickening of your hair, rapid growth, and changes in texture. Some women also have hair loss during or after pregnancy, or hair that feels dry or thin. Your hair will most likely return to normal after your baby is born.  Your breasts will continue to grow and be tender. A yellow discharge may leak from your breasts called colostrum.  Your belly button may stick out.  You may feel short of breath because of your expanding uterus.  You may notice the fetus "dropping," or moving lower in your abdomen.  You may have a bloody mucus discharge. This usually occurs a few days to a week before labor begins.  Your cervix becomes thin and soft (effaced) near your due date. WHAT TO EXPECT AT YOUR  PRENATAL EXAMS  You will have prenatal exams every 2 weeks until week 36. Then, you will have weekly prenatal exams. During a routine prenatal visit:  You will be weighed to make sure you and the fetus are growing normally.  Your blood pressure is taken.  Your abdomen will be measured to track your baby's growth.  The fetal heartbeat will be listened to.  Any test results from the previous visit will be discussed.  You may have a cervical check near your due date to see if you have effaced. At around 36 weeks, your caregiver will check your cervix. At the same time, your caregiver will also perform a test on the secretions of the vaginal tissue. This test is to determine if a type of bacteria, Group B streptococcus, is present. Your caregiver will explain this further. Your caregiver may ask you:  What your birth plan is.  How you are feeling.  If you are feeling the baby move.  If you have had any abnormal symptoms, such as leaking fluid, bleeding, severe headaches, or abdominal cramping.  If you are using any tobacco products, including cigarettes, chewing tobacco, and electronic cigarettes.  If you have any questions. Other tests or screenings that may be performed during your third trimester include:  Blood tests that check for low iron levels (anemia).  Fetal testing to check the health, activity level, and growth of the fetus. Testing is done if you have certain medical conditions or if   there are problems during the pregnancy.  HIV (human immunodeficiency virus) testing. If you are at high risk, you may be screened for HIV during your third trimester of pregnancy. FALSE LABOR You may feel small, irregular contractions that eventually go away. These are called Braxton Hicks contractions, or false labor. Contractions may last for hours, days, or even weeks before true labor sets in. If contractions come at regular intervals, intensify, or become painful, it is best to be seen  by your caregiver.  SIGNS OF LABOR   Menstrual-like cramps.  Contractions that are 5 minutes apart or less.  Contractions that start on the top of the uterus and spread down to the lower abdomen and back.  A sense of increased pelvic pressure or back pain.  A watery or bloody mucus discharge that comes from the vagina. If you have any of these signs before the 37th week of pregnancy, call your caregiver right away. You need to go to the hospital to get checked immediately. HOME CARE INSTRUCTIONS   Avoid all smoking, herbs, alcohol, and unprescribed drugs. These chemicals affect the formation and growth of the baby.  Do not use any tobacco products, including cigarettes, chewing tobacco, and electronic cigarettes. If you need help quitting, ask your health care provider. You may receive counseling support and other resources to help you quit.  Follow your caregiver's instructions regarding medicine use. There are medicines that are either safe or unsafe to take during pregnancy.  Exercise only as directed by your caregiver. Experiencing uterine cramps is a good sign to stop exercising.  Continue to eat regular, healthy meals.  Wear a good support bra for breast tenderness.  Do not use hot tubs, steam rooms, or saunas.  Wear your seat belt at all times when driving.  Avoid raw meat, uncooked cheese, cat litter boxes, and soil used by cats. These carry germs that can cause birth defects in the baby.  Take your prenatal vitamins.  Take 1500-2000 mg of calcium daily starting at the 20th week of pregnancy until you deliver your baby.  Try taking a stool softener (if your caregiver approves) if you develop constipation. Eat more high-fiber foods, such as fresh vegetables or fruit and whole grains. Drink plenty of fluids to keep your urine clear or pale yellow.  Take warm sitz baths to soothe any pain or discomfort caused by hemorrhoids. Use hemorrhoid cream if your caregiver  approves.  If you develop varicose veins, wear support hose. Elevate your feet for 15 minutes, 3-4 times a day. Limit salt in your diet.  Avoid heavy lifting, wear low heal shoes, and practice good posture.  Rest a lot with your legs elevated if you have leg cramps or low back pain.  Visit your dentist if you have not gone during your pregnancy. Use a soft toothbrush to brush your teeth and be gentle when you floss.  A sexual relationship may be continued unless your caregiver directs you otherwise.  Do not travel far distances unless it is absolutely necessary and only with the approval of your caregiver.  Take prenatal classes to understand, practice, and ask questions about the labor and delivery.  Make a trial run to the hospital.  Pack your hospital bag.  Prepare the baby's nursery.  Continue to go to all your prenatal visits as directed by your caregiver. SEEK MEDICAL CARE IF:  You are unsure if you are in labor or if your water has broken.  You have dizziness.  You have   mild pelvic cramps, pelvic pressure, or nagging pain in your abdominal area.  You have persistent nausea, vomiting, or diarrhea.  You have a bad smelling vaginal discharge.  You have pain with urination. SEEK IMMEDIATE MEDICAL CARE IF:   You have a fever.  You are leaking fluid from your vagina.  You have spotting or bleeding from your vagina.  You have severe abdominal cramping or pain.  You have rapid weight loss or gain.  You have shortness of breath with chest pain.  You notice sudden or extreme swelling of your face, hands, ankles, feet, or legs.  You have not felt your baby move in over an hour.  You have severe headaches that do not go away with medicine.  You have vision changes.   This information is not intended to replace advice given to you by your health care provider. Make sure you discuss any questions you have with your health care provider.   Document Released:  05/13/2001 Document Revised: 06/09/2014 Document Reviewed: 07/20/2012 Elsevier Interactive Patient Education 2016 [employer]  Breastfeeding Deciding to breastfeed is one of the best choices you can make for you and your baby. A change in hormones during pregnancy causes your breast tissue to grow and increases the number and size of your milk ducts. These hormones also allow proteins, sugars, and fats from your blood supply to make breast milk in your milk-producing glands. Hormones prevent breast milk from being released before your baby is born as well as prompt milk flow after birth. Once breastfeeding has begun, thoughts of your baby, as well as his or her sucking or crying, can stimulate the release of milk from your milk-producing glands.  BENEFITS OF BREASTFEEDING For Your Baby  Your first milk (colostrum) helps your baby's digestive system function better.  There are antibodies in your milk that help your baby fight off infections.  Your baby has a lower incidence of asthma, allergies, and sudden infant death syndrome.  The nutrients in breast milk are better for your baby than infant formulas and are designed uniquely for your baby's needs.  Breast milk improves your baby's brain development.  Your baby is less likely to develop other conditions, such as childhood obesity, asthma, or type 2 diabetes mellitus. For You  Breastfeeding helps to create a very special bond between you and your baby.  Breastfeeding is convenient. Breast milk is always available at the correct temperature and costs nothing.  Breastfeeding helps to burn calories and helps you lose the weight gained during pregnancy.  Breastfeeding makes your uterus contract to its prepregnancy size faster and slows bleeding (lochia) after you give birth.   Breastfeeding helps to lower your risk of developing type 2 diabetes mellitus, osteoporosis, and breast or ovarian cancer later in life. SIGNS THAT YOUR BABY IS  HUNGRY Early Signs of Hunger  Increased alertness or activity.  Stretching.  Movement of the head from side to side.  Movement of the head and opening of the mouth when the corner of the mouth or cheek is stroked (rooting).  Increased sucking sounds, smacking lips, cooing, sighing, or squeaking.  Hand-to-mouth movements.  Increased sucking of fingers or hands. Late Signs of Hunger  Fussing.  Intermittent crying. Extreme Signs of Hunger Signs of extreme hunger will require calming and consoling before your baby will be able to breastfeed successfully. Do not wait for the following signs of extreme hunger to occur before you initiate breastfeeding:  Restlessness.  A loud, strong cry.  Screaming.   BREASTFEEDING BASICS Breastfeeding Initiation  Find a comfortable place to sit or lie down, with your neck and back well supported.  Place a pillow or rolled up blanket under your baby to bring him or her to the level of your breast (if you are seated). Nursing pillows are specially designed to help support your arms and your baby while you breastfeed.  Make sure that your baby's abdomen is facing your abdomen.  Gently massage your breast. With your fingertips, massage from your chest wall toward your nipple in a circular motion. This encourages milk flow. You may need to continue this action during the feeding if your milk flows slowly.  Support your breast with 4 fingers underneath and your thumb above your nipple. Make sure your fingers are well away from your nipple and your baby's mouth.  Stroke your baby's lips gently with your finger or nipple.  When your baby's mouth is open wide enough, quickly bring your baby to your breast, placing your entire nipple and as much of the colored area around your nipple (areola) as possible into your baby's mouth.  More areola should be visible above your baby's upper lip than below the lower lip.  Your baby's tongue should be between his  or her lower gum and your breast.  Ensure that your baby's mouth is correctly positioned around your nipple (latched). Your baby's lips should create a seal on your breast and be turned out (everted).  It is common for your baby to suck about 2-3 minutes in order to start the flow of breast milk. Latching Teaching your baby how to latch on to your breast properly is very important. An improper latch can cause nipple pain and decreased milk supply for you and poor weight gain in your baby. Also, if your baby is not latched onto your nipple properly, he or she may swallow some air during feeding. This can make your baby fussy. Burping your baby when you switch breasts during the feeding can help to get rid of the air. However, teaching your baby to latch on properly is still the best way to prevent fussiness from swallowing air while breastfeeding. Signs that your baby has successfully latched on to your nipple:  Silent tugging or silent sucking, without causing you pain.  Swallowing heard between every 3-4 sucks.  Muscle movement above and in front of his or her ears while sucking. Signs that your baby has not successfully latched on to nipple:  Sucking sounds or smacking sounds from your baby while breastfeeding.  Nipple pain. If you think your baby has not latched on correctly, slip your finger into the corner of your baby's mouth to break the suction and place it between your baby's gums. Attempt breastfeeding initiation again. Signs of Successful Breastfeeding Signs from your baby:  A gradual decrease in the number of sucks or complete cessation of sucking.  Falling asleep.  Relaxation of his or her body.  Retention of a small amount of milk in his or her mouth.  Letting go of your breast by himself or herself. Signs from you:  Breasts that have increased in firmness, weight, and size 1-3 hours after feeding.  Breasts that are softer immediately after  breastfeeding.  Increased milk volume, as well as a change in milk consistency and color by the fifth day of breastfeeding.  Nipples that are not sore, cracked, or bleeding. Signs That Your Baby is Getting Enough Milk  Wetting at least 3 diapers in a 24-hour period.   The urine should be clear and pale yellow by age 5 days.  At least 3 stools in a 24-hour period by age 5 days. The stool should be soft and yellow.  At least 3 stools in a 24-hour period by age 7 days. The stool should be seedy and yellow.  No loss of weight greater than 10% of birth weight during the first 3 days of age.  Average weight gain of 4-7 ounces (113-198 g) per week after age 4 days.  Consistent daily weight gain by age 5 days, without weight loss after the age of 2 weeks. After a feeding, your baby may spit up a small amount. This is common. BREASTFEEDING FREQUENCY AND DURATION Frequent feeding will help you make more milk and can prevent sore nipples and breast engorgement. Breastfeed when you feel the need to reduce the fullness of your breasts or when your baby shows signs of hunger. This is called "breastfeeding on demand." Avoid introducing a pacifier to your baby while you are working to establish breastfeeding (the first 4-6 weeks after your baby is born). After this time you may choose to use a pacifier. Research has shown that pacifier use during the first year of a baby's life decreases the risk of sudden infant death syndrome (SIDS). Allow your baby to feed on each breast as long as he or she wants. Breastfeed until your baby is finished feeding. When your baby unlatches or falls asleep while feeding from the first breast, offer the second breast. Because newborns are often sleepy in the first few weeks of life, you may need to awaken your baby to get him or her to feed. Breastfeeding times will vary from baby to baby. However, the following rules can serve as a guide to help you ensure that your baby is  properly fed:  Newborns (babies 4 weeks of age or younger) may breastfeed every 1-3 hours.  Newborns should not go longer than 3 hours during the day or 5 hours during the night without breastfeeding.  You should breastfeed your baby a minimum of 8 times in a 24-hour period until you begin to introduce solid foods to your baby at around 6 months of age. BREAST MILK PUMPING Pumping and storing breast milk allows you to ensure that your baby is exclusively fed your breast milk, even at times when you are unable to breastfeed. This is especially important if you are going back to work while you are still breastfeeding or when you are not able to be present during feedings. Your lactation consultant can give you guidelines on how long it is safe to store breast milk. A breast pump is a machine that allows you to pump milk from your breast into a sterile bottle. The pumped breast milk can then be stored in a refrigerator or freezer. Some breast pumps are operated by hand, while others use electricity. Ask your lactation consultant which type will work best for you. Breast pumps can be purchased, but some hospitals and breastfeeding support groups lease breast pumps on a monthly basis. A lactation consultant can teach you how to hand express breast milk, if you prefer not to use a pump. CARING FOR YOUR BREASTS WHILE YOU BREASTFEED Nipples can become dry, cracked, and sore while breastfeeding. The following recommendations can help keep your breasts moisturized and healthy:  Avoid using soap on your nipples.  Wear a supportive bra. Although not required, special nursing bras and tank tops are designed to allow access to your   breasts for breastfeeding without taking off your entire bra or top. Avoid wearing underwire-style bras or extremely tight bras.  Air dry your nipples for 3-4minutes after each feeding.  Use only cotton bra pads to absorb leaked breast milk. Leaking of breast milk between feedings  is normal.  Use lanolin on your nipples after breastfeeding. Lanolin helps to maintain your skin's normal moisture barrier. If you use pure lanolin, you do not need to wash it off before feeding your baby again. Pure lanolin is not toxic to your baby. You may also hand express a few drops of breast milk and gently massage that milk into your nipples and allow the milk to air dry. In the first few weeks after giving birth, some women experience extremely full breasts (engorgement). Engorgement can make your breasts feel heavy, warm, and tender to the touch. Engorgement peaks within 3-5 days after you give birth. The following recommendations can help ease engorgement:  Completely empty your breasts while breastfeeding or pumping. You may want to start by applying warm, moist heat (in the shower or with warm water-soaked hand towels) just before feeding or pumping. This increases circulation and helps the milk flow. If your baby does not completely empty your breasts while breastfeeding, pump any extra milk after he or she is finished.  Wear a snug bra (nursing or regular) or tank top for 1-2 days to signal your body to slightly decrease milk production.  Apply ice packs to your breasts, unless this is too uncomfortable for you.  Make sure that your baby is latched on and positioned properly while breastfeeding. If engorgement persists after 48 hours of following these recommendations, contact your health care provider or a lactation consultant. OVERALL HEALTH CARE RECOMMENDATIONS WHILE BREASTFEEDING  Eat healthy foods. Alternate between meals and snacks, eating 3 of each per day. Because what you eat affects your breast milk, some of the foods may make your baby more irritable than usual. Avoid eating these foods if you are sure that they are negatively affecting your baby.  Drink milk, fruit juice, and water to satisfy your thirst (about 10 glasses a day).  Rest often, relax, and continue to take  your prenatal vitamins to prevent fatigue, stress, and anemia.  Continue breast self-awareness checks.  Avoid chewing and smoking tobacco. Chemicals from cigarettes that pass into breast milk and exposure to secondhand smoke may harm your baby.  Avoid alcohol and drug use, including marijuana. Some medicines that may be harmful to your baby can pass through breast milk. It is important to ask your health care provider before taking any medicine, including all over-the-counter and prescription medicine as well as vitamin and herbal supplements. It is possible to become pregnant while breastfeeding. If birth control is desired, ask your health care provider about options that will be safe for your baby. SEEK MEDICAL CARE IF:  You feel like you want to stop breastfeeding or have become frustrated with breastfeeding.  You have painful breasts or nipples.  Your nipples are cracked or bleeding.  Your breasts are red, tender, or warm.  You have a swollen area on either breast.  You have a fever or chills.  You have nausea or vomiting.  You have drainage other than breast milk from your nipples.  Your breasts do not become full before feedings by the fifth day after you give birth.  You feel sad and depressed.  Your baby is too sleepy to eat well.  Your baby is having trouble sleeping.     Your baby is wetting less than 3 diapers in a 24-hour period.  Your baby has less than 3 stools in a 24-hour period.  Your baby's skin or the white part of his or her eyes becomes yellow.   Your baby is not gaining weight by 5 days of age. SEEK IMMEDIATE MEDICAL CARE IF:  Your baby is overly tired (lethargic) and does not want to wake up and feed.  Your baby develops an unexplained fever.   This information is not intended to replace advice given to you by your health care provider. Make sure you discuss any questions you have with your health care provider.   Document Released: 05/19/2005  Document Revised: 02/07/2015 Document Reviewed: 11/10/2012 Elsevier Interactive Patient Education 2016 [employer]  

## 2015-03-09 NOTE — Progress Notes (Signed)
Subjective:  Tabitha Rose is a 29 y.o. G1P0000 at [redacted]w[redacted]d being seen today for ongoing prenatal care.  Patient reports no complaints.  Contractions: Irregular.  Vag. Bleeding: None. Movement: Present. Denies leaking of fluid.   The following portions of the patient's history were reviewed and updated as appropriate: allergies, current medications, past family history, past medical history, past social history, past surgical history and problem list.   Objective:   Filed Vitals:   03/09/15 1037  BP: 138/85  Pulse: 93  Weight: 174 lb (78.926 kg)    Fetal Status: Fetal Heart Rate (bpm): 159   Movement: Present     General:  Alert, oriented and cooperative. Patient is in no acute distress.  Skin: Skin is warm and dry. No rash noted.   Cardiovascular: Normal heart rate noted  Respiratory: Normal respiratory effort, no problems with respiration noted  Abdomen: Soft, gravid, appropriate for gestational age. Pain/Pressure: Absent     Pelvic: Vag. Bleeding: None Vag D/C Character: Thin   Cervical exam performed 0.5/70/-2        Extremities: Normal range of motion.  Edema: Trace  Mental Status: Normal mood and affect. Normal behavior. Normal judgment and thought content.   Urinalysis: Urine Protein: Negative Urine Glucose: Negative  Assessment and Plan:  Pregnancy: G1P0000 at [redacted]w[redacted]d  1. Supervision of normal first pregnancy, antepartum, third trimester Continue routine prenatal care.  Term labor symptoms and general obstetric precautions including but not limited to vaginal bleeding, contractions, leaking of fluid and fetal movement were reviewed in detail with the patient. Please refer to After Visit Summary for other counseling recommendations.  Return in 1 week (on 03/16/2015).   Reva Bores, MD

## 2015-03-16 ENCOUNTER — Encounter: Payer: Self-pay | Admitting: Obstetrics & Gynecology

## 2015-03-16 ENCOUNTER — Ambulatory Visit (INDEPENDENT_AMBULATORY_CARE_PROVIDER_SITE_OTHER): Payer: BC Managed Care – PPO | Admitting: Obstetrics & Gynecology

## 2015-03-16 VITALS — BP 126/85 | HR 94 | Wt 177.0 lb

## 2015-03-16 DIAGNOSIS — Z3403 Encounter for supervision of normal first pregnancy, third trimester: Secondary | ICD-10-CM

## 2015-03-16 NOTE — Progress Notes (Signed)
Pt c/o vaginal odor for 2 days, has resolved.

## 2015-03-23 ENCOUNTER — Ambulatory Visit (INDEPENDENT_AMBULATORY_CARE_PROVIDER_SITE_OTHER): Payer: BC Managed Care – PPO | Admitting: Certified Nurse Midwife

## 2015-03-23 VITALS — BP 113/79 | HR 98 | Wt 177.0 lb

## 2015-03-23 DIAGNOSIS — Z3403 Encounter for supervision of normal first pregnancy, third trimester: Secondary | ICD-10-CM

## 2015-03-23 DIAGNOSIS — O480 Post-term pregnancy: Secondary | ICD-10-CM | POA: Diagnosis not present

## 2015-03-23 NOTE — Progress Notes (Signed)
NST reactive.  Scheduling induction for next week.  Patient will return to office Monday for NST.

## 2015-03-26 ENCOUNTER — Inpatient Hospital Stay (HOSPITAL_COMMUNITY): Payer: BC Managed Care – PPO | Admitting: Anesthesiology

## 2015-03-26 ENCOUNTER — Inpatient Hospital Stay (HOSPITAL_COMMUNITY)
Admission: AD | Admit: 2015-03-26 | Discharge: 2015-03-28 | DRG: 775 | Disposition: A | Discharge status: Home / Self Care | Payer: BC Managed Care – PPO | Source: Ambulatory Visit | Attending: Family Medicine | Admitting: Family Medicine

## 2015-03-26 ENCOUNTER — Encounter (HOSPITAL_COMMUNITY): Payer: Self-pay | Admitting: *Deleted

## 2015-03-26 ENCOUNTER — Other Ambulatory Visit: Payer: BC Managed Care – PPO

## 2015-03-26 DIAGNOSIS — O4290 Premature rupture of membranes, unspecified as to length of time between rupture and onset of labor, unspecified weeks of gestation: Secondary | ICD-10-CM | POA: Diagnosis present

## 2015-03-26 DIAGNOSIS — O99824 Streptococcus B carrier state complicating childbirth: Secondary | ICD-10-CM | POA: Diagnosis present

## 2015-03-26 DIAGNOSIS — Z3A40 40 weeks gestation of pregnancy: Secondary | ICD-10-CM

## 2015-03-26 DIAGNOSIS — O4210 Premature rupture of membranes, onset of labor more than 24 hours following rupture, unspecified weeks of gestation: Secondary | ICD-10-CM | POA: Diagnosis not present

## 2015-03-26 DIAGNOSIS — Z8659 Personal history of other mental and behavioral disorders: Secondary | ICD-10-CM

## 2015-03-26 DIAGNOSIS — Z3400 Encounter for supervision of normal first pregnancy, unspecified trimester: Secondary | ICD-10-CM

## 2015-03-26 DIAGNOSIS — O4292 Full-term premature rupture of membranes, unspecified as to length of time between rupture and onset of labor: Secondary | ICD-10-CM | POA: Diagnosis present

## 2015-03-26 DIAGNOSIS — O322XX0 Maternal care for transverse and oblique lie, not applicable or unspecified: Secondary | ICD-10-CM | POA: Diagnosis present

## 2015-03-26 DIAGNOSIS — Z3403 Encounter for supervision of normal first pregnancy, third trimester: Secondary | ICD-10-CM

## 2015-03-26 LAB — CBC
HCT: 38.2 % (ref 36.0–46.0)
Hemoglobin: 13.2 g/dL (ref 12.0–15.0)
MCH: 30.3 pg (ref 26.0–34.0)
MCHC: 34.6 g/dL (ref 30.0–36.0)
MCV: 87.6 fL (ref 78.0–100.0)
Platelets: 244 10*3/uL (ref 150–400)
RBC: 4.36 MIL/uL (ref 3.87–5.11)
RDW: 13 % (ref 11.5–15.5)
WBC: 15.8 10*3/uL — ABNORMAL HIGH (ref 4.0–10.5)

## 2015-03-26 LAB — TYPE AND SCREEN
ABO/RH(D): O POS
Antibody Screen: NEGATIVE

## 2015-03-26 LAB — POCT FERN TEST: POCT FERN TEST: POSITIVE

## 2015-03-26 LAB — ABO/RH: ABO/RH(D): O POS

## 2015-03-26 LAB — RPR: RPR Ser Ql: NONREACTIVE

## 2015-03-26 LAB — OB RESULTS CONSOLE GBS: STREP GROUP B AG: NEGATIVE

## 2015-03-26 MED ORDER — LIDOCAINE HCL (PF) 1 % IJ SOLN
INTRAMUSCULAR | Status: DC | PRN
  Administered 2015-03-26 (×2): 4 mL

## 2015-03-26 MED ORDER — CITRIC ACID-SODIUM CITRATE 334-500 MG/5ML PO SOLN: 30.0000 mL | ORAL | Status: DC | PRN

## 2015-03-26 MED ORDER — FENTANYL 2.5 MCG/ML BUPIVACAINE 1/10 % EPIDURAL INFUSION (WH - ANES)
14.0000 mL/h | INTRAMUSCULAR | Status: DC | PRN
  Administered 2015-03-26 (×4): 14 mL/h via EPIDURAL
  Filled 2015-03-26 (×3): qty 125

## 2015-03-26 MED ORDER — PENICILLIN G POTASSIUM 5000000 UNITS IJ SOLR
5.0000 10*6.[IU] | Freq: Once | INTRAVENOUS | Status: AC
  Administered 2015-03-26: 5 10*6.[IU] via INTRAVENOUS
  Filled 2015-03-26: qty 5

## 2015-03-26 MED ORDER — OXYTOCIN 40 UNITS IN LACTATED RINGERS INFUSION - SIMPLE MED
62.5000 mL/h | INTRAVENOUS | Status: DC
  Administered 2015-03-26: 22:00:00 via INTRAVENOUS
  Administered 2015-03-26: 2 m[IU]/min via INTRAVENOUS
  Filled 2015-03-26: qty 1000

## 2015-03-26 MED ORDER — DIPHENHYDRAMINE HCL 50 MG/ML IJ SOLN: 12.5000 mg | INTRAMUSCULAR | Status: DC | PRN

## 2015-03-26 MED ORDER — ONDANSETRON HCL 4 MG/2ML IJ SOLN: 4.0000 mg | Freq: Four times a day (QID) | INTRAMUSCULAR | Status: DC | PRN

## 2015-03-26 MED ORDER — FENTANYL CITRATE (PF) 100 MCG/2ML IJ SOLN: 50.0000 ug | INTRAMUSCULAR | Status: DC | PRN

## 2015-03-26 MED ORDER — SODIUM BICARBONATE 8.4 % IV SOLN
INTRAVENOUS | Status: DC | PRN
  Administered 2015-03-26 (×2): 3 mL via EPIDURAL

## 2015-03-26 MED ORDER — FLEET ENEMA 7-19 GM/118ML RE ENEM: 1.0000 | ENEMA | Freq: Every day | RECTAL | Status: DC | PRN

## 2015-03-26 MED ORDER — LACTATED RINGERS IV SOLN: 500.0000 mL | INTRAVENOUS | Status: DC | PRN

## 2015-03-26 MED ORDER — PENICILLIN G POTASSIUM 5000000 UNITS IJ SOLR
2.5000 10*6.[IU] | INTRAVENOUS | Status: DC
  Filled 2015-03-26 (×3): qty 2.5

## 2015-03-26 MED ORDER — EPHEDRINE 5 MG/ML INJ
10.0000 mg | INTRAVENOUS | Status: DC | PRN
  Filled 2015-03-26: qty 2

## 2015-03-26 MED ORDER — OXYTOCIN BOLUS FROM INFUSION: 500.0000 mL | INTRAVENOUS | Status: DC

## 2015-03-26 MED ORDER — OXYCODONE-ACETAMINOPHEN 5-325 MG PO TABS: 2.0000 | ORAL_TABLET | ORAL | Status: DC | PRN

## 2015-03-26 MED ORDER — PHENYLEPHRINE 40 MCG/ML (10ML) SYRINGE FOR IV PUSH (FOR BLOOD PRESSURE SUPPORT)
80.0000 ug | PREFILLED_SYRINGE | INTRAVENOUS | Status: DC | PRN
  Filled 2015-03-26: qty 2

## 2015-03-26 MED ORDER — LACTATED RINGERS IV SOLN
INTRAVENOUS | Status: DC
  Administered 2015-03-26 (×2): via INTRAVENOUS
  Administered 2015-03-26: 125 mL/h via INTRAVENOUS

## 2015-03-26 MED ORDER — PHENYLEPHRINE 40 MCG/ML (10ML) SYRINGE FOR IV PUSH (FOR BLOOD PRESSURE SUPPORT)
PREFILLED_SYRINGE | INTRAVENOUS | Status: AC
  Filled 2015-03-26: qty 20

## 2015-03-26 MED ORDER — LIDOCAINE HCL (PF) 1 % IJ SOLN
30.0000 mL | INTRAMUSCULAR | Status: DC | PRN
  Administered 2015-03-27: 30 mL via SUBCUTANEOUS
  Filled 2015-03-26: qty 30

## 2015-03-26 MED ORDER — ACETAMINOPHEN 325 MG PO TABS: 650.0000 mg | ORAL_TABLET | ORAL | Status: DC | PRN

## 2015-03-26 MED ORDER — FENTANYL 2.5 MCG/ML BUPIVACAINE 1/10 % EPIDURAL INFUSION (WH - ANES)
INTRAMUSCULAR | Status: AC
  Filled 2015-03-26: qty 125

## 2015-03-26 MED ORDER — OXYCODONE-ACETAMINOPHEN 5-325 MG PO TABS: 1.0000 | ORAL_TABLET | ORAL | Status: DC | PRN

## 2015-03-26 MED ORDER — OXYTOCIN 40 UNITS IN LACTATED RINGERS INFUSION - SIMPLE MED
1.0000 m[IU]/min | INTRAVENOUS | Status: DC
  Administered 2015-03-26: 6 m[IU]/min via INTRAVENOUS

## 2015-03-26 MED ORDER — TERBUTALINE SULFATE 1 MG/ML IJ SOLN
0.2500 mg | Freq: Once | INTRAMUSCULAR | Status: DC | PRN
  Filled 2015-03-26: qty 1

## 2015-03-26 NOTE — Anesthesia Procedure Notes (Signed)
Epidural Patient location during procedure: OB  Staffing Anesthesiologist: Jennelle Pinkstaff Performed by: anesthesiologist   Preanesthetic Checklist Completed: patient identified, site marked, surgical consent, pre-op evaluation, timeout performed, IV checked, risks and benefits discussed and monitors and equipment checked  Epidural Patient position: sitting Prep: site prepped and draped and DuraPrep Patient monitoring: continuous pulse ox and blood pressure Approach: midline Location: L3-L4 Injection technique: LOR saline  Needle:  Needle type: Tuohy  Needle gauge: 17 G Needle length: 9 cm and 9 Needle insertion depth: 6 cm Catheter type: closed end flexible Catheter size: 19 Gauge Catheter at skin depth: 10 cm Test dose: negative  Assessment Events: blood not aspirated, injection not painful, no injection resistance, negative IV test and no paresthesia  Additional Notes Patient identified. Risks/Benefits/Options discussed with patient including but not limited to bleeding, infection, nerve damage, paralysis, failed block, incomplete pain control, headache, blood pressure changes, nausea, vomiting, reactions to medication both or allergic, itching and postpartum back pain. Confirmed with bedside nurse the patient's most recent platelet count. Confirmed with patient that they are not currently taking any anticoagulation, have any bleeding history or any family history of bleeding disorders. Patient expressed understanding and wished to proceed. All questions were answered. Sterile technique was used throughout the entire procedure. Please see nursing notes for vital signs. Test dose was given through epidural catheter and negative prior to continuing to dose epidural or start infusion. Warning signs of high block given to the patient including shortness of breath, tingling/numbness in hands, complete motor block, or any concerning symptoms with instructions to call for help. Patient was  given instructions on fall risk and not to get out of bed. All questions and concerns addressed with instructions to call with any issues or inadequate analgesia.    

## 2015-03-26 NOTE — Progress Notes (Signed)
Waldon ReiningMarisa Nichelson is a 29 y.o. G1P0000 at 7864w5d by ultrasound admitted for rupture of membranes  Subjective: Doing well. Comfortable with epidural  Objective: BP 130/79 mmHg  Pulse 100  Temp(Src) 98.1 F (36.7 C) (Oral)  Resp 20  Ht 5\' 2"  (1.575 m)  Wt 80.287 kg (177 lb)  BMI 32.37 kg/m2  SpO2 100%  LMP 06/14/2014      FHT:  FHR: 140 bpm, variability: moderate,  accelerations:  Present,  decelerations:  Absent UC:   irregular, every 2-3 minutes SVE:   Dilation: 5 Effacement (%): 100 Station: -1 Exam by:: Gifford ShaveYancey Luft RN  Labs: Lab Results  Component Value Date   WBC 9.9 12/25/2014   HGB 12.0 12/25/2014   HCT 34.3* 12/25/2014   MCV 88.9 12/25/2014   PLT 303 12/25/2014    Assessment / Plan: Augmentation of labor, progressing well  Labor: Progressing normally Preeclampsia:  n/a Fetal Wellbeing:  Category I Pain Control:  Epidural I/D:  n/a Anticipated MOD:  NSVD  Eula Mazzola 03/26/2015, 9:23 AM

## 2015-03-26 NOTE — Anesthesia Preprocedure Evaluation (Signed)
Anesthesia Evaluation  Patient identified by MRN, date of birth, ID band Patient awake    Reviewed: Allergy & Precautions, NPO status , Patient's Chart, lab work & pertinent test results  History of Anesthesia Complications Negative for: history of anesthetic complications  Airway Mallampati: II  TM Distance: >3 FB Neck ROM: Full    Dental no notable dental hx. (+) Dental Advisory Given   Pulmonary neg pulmonary ROS,    Pulmonary exam normal breath sounds clear to auscultation       Cardiovascular negative cardio ROS Normal cardiovascular exam Rhythm:Regular Rate:Normal     Neuro/Psych negative neurological ROS  negative psych ROS   GI/Hepatic negative GI ROS, Neg liver ROS,   Endo/Other  obesity  Renal/GU negative Renal ROS  negative genitourinary   Musculoskeletal negative musculoskeletal ROS (+)   Abdominal   Peds negative pediatric ROS (+)  Hematology negative hematology ROS (+)   Anesthesia Other Findings   Reproductive/Obstetrics (+) Pregnancy                             Anesthesia Physical Anesthesia Plan  ASA: II  Anesthesia Plan: Epidural   Post-op Pain Management:    Induction:   Airway Management Planned:   Additional Equipment:   Intra-op Plan:   Post-operative Plan:   Informed Consent: I have reviewed the patients History and Physical, chart, labs and discussed the procedure including the risks, benefits and alternatives for the proposed anesthesia with the patient or authorized representative who has indicated his/her understanding and acceptance.   Dental advisory given  Plan Discussed with: CRNA  Anesthesia Plan Comments: (Platelets reviewed from lab on printed out sheet prior to epidural placement, >200)        Anesthesia Quick Evaluation

## 2015-03-26 NOTE — Progress Notes (Signed)
Patient ID: [name], female   DOB: 1986-01-03, 29 y.o.   MRN: [id number] Doing well.   Has progressed well in labor today  Filed Vitals:   03/26/15 1531 03/26/15 1601 03/26/15 1631 03/26/15 1701  BP: 138/68 144/75 137/78 137/66  Pulse: 120 113 109 124  Temp:   99.9 F (37.7 C)   TempSrc:   Oral   Resp:      Height:      Weight:      SpO2:       FHR reassuring UCs regular Dilation: Lip/rim Effacement (%): 100 Cervical Position: Anterior Station: +1 Presentation: Vertex Exam by:: J.Cox, RN  Anticipate second stage Will probably need to labor down prior to pushing

## 2015-03-26 NOTE — MAU Note (Signed)
SROM 0230-clear. Contractions every 3 mins. +FM

## 2015-03-26 NOTE — H&P (Signed)
LABOR ADMISSION HISTORY AND PHYSICAL  Tabitha ReiningMarisa Rose is a 29 y.o. female G1P0000 with IUP at 952w5d by LMP presenting for PROM at 0230. This was confirmed by positive fern test in the MAU. She reports +FM, + contractions, no VB, no blurry vision, headaches or peripheral edema, and RUQ pain. Contractions have increased in frequency and intensity since late last evening (03/25/15). She plans on breast feeding. She anticipates wanting pills for birth control.   Dating: By LMP --->  Estimated Date of Delivery: 03/21/15  Sono:    @[redacted]w[redacted]d , CWD, normal anatomy, cephalic presentation, transverse lie, 299 g   Prenatal History/Complications:  Past Medical History: No past medical history on file.  Past Surgical History: Past Surgical History  Procedure Laterality Date  . Wisdom tooth extraction      Obstetrical History: OB History    Gravida Para Term Preterm AB TAB SAB Ectopic Multiple Living   1 0 0 0 0 0 0 0 0 0       Social History: Social History   Social History  . Marital Status: Married    Spouse Name: N/A  . Number of Children: N/A  . Years of Education: N/A   Social History Main Topics  . Smoking status: Never Smoker   . Smokeless tobacco: Not on file  . Alcohol Use: No  . Drug Use: No  . Sexual Activity:    Partners: Male   Other Topics Concern  . Not on file   Social History Narrative    Family History: Family History  Problem Relation Age of Onset  . Breast cancer Mother 2543    Allergies: No Known Allergies  Prescriptions prior to admission  Medication Sig Dispense Refill Last Dose  . Doxylamine-Pyridoxine (DICLEGIS) 10-10 MG TBEC Take 1 tablet by mouth 2 (two) times daily. (Patient not taking: Reported on 03/09/2015) 60 tablet 1 Not Taking  . Prenatal Vit-Fe Fumarate-FA (MULTIVITAMIN-PRENATAL) 27-0.8 MG TABS tablet Take 1 tablet by mouth daily at 12 noon.   Taking     Review of Systems   All systems reviewed and negative except as stated in HPI  BP  125/80 mmHg  Pulse 93  Temp(Src) 98.2 F (36.8 C) (Oral)  Resp 20  LMP 06/14/2014 General appearance: alert, cooperative and mild distress Lungs: clear to auscultation bilaterally Heart: regular rate and rhythm Abdomen: soft, non-tender; bowel sounds normal Pelvic: Cervix dilated to 2 cm.  Extremities: Homans sign is negative, no sign of DVT, edema Presentation: cephalic Fetal monitoringBaseline: 145 bpm, Variability: Good {> 6 bpm), Accelerations: Reactive and Decelerations: Absent Uterine activityFrequency: Every 2-3 minutes, Duration: 50-80 seconds and Intensity: moderate Dilation: 2 Effacement (%): 80, 90 Station: -2 Exam by:: D Simpson RN   Prenatal labs: ABO, Rh: O/POS/-- (07/25 1451) Antibody: Negative (04/07 0000) Rubella:  Immune (09/07/14) RPR: NON REAC (07/25 1451)  HBsAg: Negative (04/07 0000)  HIV: NONREACTIVE (07/25 1451)  GBS: Positive (09/23 0000)  1 hr Glucola 156 on 12/25/14; 3 hr Glucola 47/131/124/155 (one abnormal value) Genetic screening: primary screen normal Anatomy US nml  Prenatal Transfer Tool  Maternal Diabetes: No Genetic Screening: Normal Maternal Ultrasounds/Referrals: Normal Fetal Ultrasounds or other Referrals:  None Maternal Substance Abuse:  No Significant Maternal Medications:  None Significant Maternal Lab Results: Lab values include: Group B Strep positive (may be an error, will clarify)  Results for orders placed or performed during the hospital encounter of 03/26/15 (from the past 24 hour(s))  Fern Test   Collection Time: 03/26/15  3:50 AM  Result Value Ref Range   POCT Fern Test Positive = ruptured amniotic membanes     Patient Active Problem List   Diagnosis Date Noted  . Supervision of normal first pregnancy, antepartum 11/27/2014  . History of depression 11/27/2014    Assessment: Tabitha Rose is a 29 y.o. G1P0000 at [redacted]w[redacted]d here for PROM.   #Labor: Expectant management.  #Pain: Not planning on epidural.  #FWB: Cat  I #ID:  GBS negative per lab result 02/23/15, however GBS positive in chart. Ordered PCN. Will attempt to clarify and potentially stop abx prior to delivery. #MOF: Breast #MOC: pill #Circ:  N/A (girl)  Tabitha Gobble, MD [facility], PGY-1  `````Attestation of Attending Supervision of Advanced Practitioner: Evaluation and management procedures were performed by the PA/NP/CNM/OB Fellow under my supervision/collaboration. Chart reviewed and agree with management and plan. Vaginal culture this pregnancy NEGATIVE for GBS  Tabitha Rose V 03/26/2015 6:47 AM

## 2015-03-26 NOTE — Progress Notes (Signed)
Patient off monitors for ambulation.

## 2015-03-27 ENCOUNTER — Encounter (HOSPITAL_COMMUNITY): Payer: Self-pay | Admitting: *Deleted

## 2015-03-27 DIAGNOSIS — O4292 Full-term premature rupture of membranes, unspecified as to length of time between rupture and onset of labor: Secondary | ICD-10-CM

## 2015-03-27 DIAGNOSIS — Z3A40 40 weeks gestation of pregnancy: Secondary | ICD-10-CM

## 2015-03-27 DIAGNOSIS — O4210 Premature rupture of membranes, onset of labor more than 24 hours following rupture, unspecified weeks of gestation: Secondary | ICD-10-CM | POA: Diagnosis not present

## 2015-03-27 MED ORDER — BENZOCAINE-MENTHOL 20-0.5 % EX AERO
1.0000 "application " | INHALATION_SPRAY | CUTANEOUS | Status: DC | PRN
  Administered 2015-03-27: 1 via TOPICAL
  Filled 2015-03-27 (×2): qty 56

## 2015-03-27 MED ORDER — SENNOSIDES-DOCUSATE SODIUM 8.6-50 MG PO TABS
2.0000 | ORAL_TABLET | ORAL | Status: DC
  Administered 2015-03-28: 2 via ORAL
  Filled 2015-03-27: qty 2

## 2015-03-27 MED ORDER — SIMETHICONE 80 MG PO CHEW: 80.0000 mg | CHEWABLE_TABLET | ORAL | Status: DC | PRN

## 2015-03-27 MED ORDER — TETANUS-DIPHTH-ACELL PERTUSSIS 5-2.5-18.5 LF-MCG/0.5 IM SUSP: 0.5000 mL | Freq: Once | INTRAMUSCULAR | Status: DC

## 2015-03-27 MED ORDER — ACETAMINOPHEN 325 MG PO TABS: 650.0000 mg | ORAL_TABLET | ORAL | Status: DC | PRN

## 2015-03-27 MED ORDER — IBUPROFEN 600 MG PO TABS
600.0000 mg | ORAL_TABLET | Freq: Four times a day (QID) | ORAL | Status: DC
  Administered 2015-03-27 – 2015-03-28 (×6): 600 mg via ORAL
  Filled 2015-03-27 (×6): qty 1

## 2015-03-27 MED ORDER — DIBUCAINE 1 % RE OINT: 1.0000 "application " | TOPICAL_OINTMENT | RECTAL | Status: DC | PRN

## 2015-03-27 MED ORDER — WITCH HAZEL-GLYCERIN EX PADS: 1.0000 "application " | MEDICATED_PAD | CUTANEOUS | Status: DC | PRN

## 2015-03-27 MED ORDER — OXYCODONE-ACETAMINOPHEN 5-325 MG PO TABS: 2.0000 | ORAL_TABLET | ORAL | Status: DC | PRN

## 2015-03-27 MED ORDER — ZOLPIDEM TARTRATE 5 MG PO TABS: 5.0000 mg | ORAL_TABLET | Freq: Every evening | ORAL | Status: DC | PRN

## 2015-03-27 MED ORDER — DIPHENHYDRAMINE HCL 25 MG PO CAPS: 25.0000 mg | ORAL_CAPSULE | Freq: Four times a day (QID) | ORAL | Status: DC | PRN

## 2015-03-27 MED ORDER — LANOLIN HYDROUS EX OINT: TOPICAL_OINTMENT | CUTANEOUS | Status: DC | PRN

## 2015-03-27 MED ORDER — ONDANSETRON HCL 4 MG PO TABS: 4.0000 mg | ORAL_TABLET | ORAL | Status: DC | PRN

## 2015-03-27 MED ORDER — PRENATAL MULTIVITAMIN CH
1.0000 | ORAL_TABLET | Freq: Every day | ORAL | Status: DC
  Administered 2015-03-27 – 2015-03-28 (×2): 1 via ORAL
  Filled 2015-03-27 (×2): qty 1

## 2015-03-27 MED ORDER — ONDANSETRON HCL 4 MG/2ML IJ SOLN: 4.0000 mg | INTRAMUSCULAR | Status: DC | PRN

## 2015-03-27 MED ORDER — OXYCODONE-ACETAMINOPHEN 5-325 MG PO TABS: 1.0000 | ORAL_TABLET | ORAL | Status: DC | PRN

## 2015-03-27 NOTE — Lactation Note (Signed)
This note was copied from the chart of Tabitha Waldon ReiningMarisa Neuzil. Lactation Consultation Note  Patient Name: Tabitha Rose ZOXWR'UToday's Date: 03/27/2015 Reason for consult: Initial assessment Baby at 14 hr of life and has been bf well until to now. Parents report baby will not wake to feed. FOB has been trying for about 15 minutes to wake her, mom tried expressed milk, and sts with no results. Went over newborn behavior. Praised efforts. Went over pacifier use, breast changes, milk transition, nipple care, and manually expression. Colostrum was noted bilaterally. Lactation handouts given, aware of O/P services and support group. She will call as needed for bf help.     Maternal Data Has patient been taught Hand Expression?: Yes  Feeding    LATCH Score/Interventions                      Lactation Tools Discussed/Used WIC Program: No   Consult Status Consult Status: Follow-up Date: 03/28/15 Follow-up type: In-patient    Rulon Eisenmengerlizabeth E Annalisia Ingber 03/27/2015, 5:26 PM

## 2015-03-27 NOTE — Anesthesia Postprocedure Evaluation (Signed)
  Anesthesia Post-op Note  Patient: Tabitha Rose  Procedure(s) Performed: * No procedures listed *  Patient Location: PACU and Mother/Baby  Anesthesia Type:Epidural  Level of Consciousness: awake, alert  and oriented  Airway and Oxygen Therapy: Patient Spontanous Breathing  Post-op Pain: none  Post-op Assessment: Post-op Vital signs reviewed, Patient's Cardiovascular Status Stable, No headache, No backache, No residual numbness and No residual motor weakness  Post-op Vital Signs: Reviewed and stable  Complications: No apparent anesthesia complications

## 2015-03-28 MED ORDER — ACETAMINOPHEN 325 MG PO TABS: 650.0000 mg | ORAL_TABLET | ORAL | Status: DC | PRN

## 2015-03-28 MED ORDER — IBUPROFEN 600 MG PO TABS: 600.0000 mg | ORAL_TABLET | Freq: Four times a day (QID) | ORAL | Status: DC

## 2015-03-28 NOTE — Clinical Social Work Maternal (Signed)
CLINICAL SOCIAL WORK MATERNAL/CHILD NOTE  Patient Details  Name: Tabitha Rose MRN: [id number] Date of Birth: 09/23/1985  Date:  03/28/2015  Clinical Social Worker Initiating Note:  Loleta BooksSarah Shawn Dannenberg MSW, LCSW Date/ Time Initiated:  03/28/15/0930     Child's Name:  Marjoria    Legal Guardian:  Tabitha ReiningMarisa Nhem and Clyda Hurdlehris [place]  Need for Interpreter:  None   Date of Referral:  08/11/2014     Reason for Referral:  History of depression  [facility]   Address:  [address] [place], [place] [address]  Phone number:  [phone]   Household Members:  Spouse   Natural Supports (not living in the home):  Immediate Family, Extended Family   Professional Supports: None   Employment: Homemaker   Type of Work: 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    Education:  IT sales professionalCollege graduate   Financial Resources:  Media plannerrivate Insurance   Other Resources:    None identified   Cultural/Religious Considerations Which May Impact Care:  None reported  Strengths:  Ability to meet basic needs , Pediatrician chosen , Home prepared for child    Risk Factors/Current Problems:  None   Cognitive State:  Able to Concentrate , Alert , Goal Oriented , Linear Thinking , Insightful    Mood/Affect:  Animated, Bright , Happy , Interested , Comfortable , Calm    CSW Assessment:  CSW received request for consult due to MOB presenting with a history of depression.  MOB and FOB presented as easily engaged and receptive to the visit. The MOB was noted to be in a pleasant mood and displayed a full range in affect. The FOB participated in the assessment as well, but offered to care for the infant in order for MOB to fully engage with CSW.  MOB smiled frequently during the assessment, and discussed feelings of excitement secondary to the infant's birth.  She expressed eagerness and readiness to return home, and shared that she feels comfortable caring for  infants since she has previous experience caring for babies.  MOB and FOB reported that they believe they have a strong support system and the home prepared for the infant.   MOB discussed impressions that she is adjusting well to the transition postpartum.  She shared that she has attempted to not have expectations in regards to the birth process and breastfeeding since she knows that "anything can happen".  MOB shared that she originally planned to have a non-medicated birth.  MOB reflected upon how she felt during the labor when the labor continued and she experienced intense pain, and shared that she is glad that she decided to have an epidural.  MOB shared that despite the change in her ideal birth plan, she feels content with the decision since she knows that it was the best for her and the infant. She discussed how her family was supportive despite her concerns that they provide her with negative feedback since other family members had non-medicated birth.  MOB shared an awareness that each birth story and process is unique and different, and she overall reported feelings of happiness secondary to the infant's birth.   MOB displayed flexible thought process as she reflected upon her experience, and she verbalized an awareness of need to closely monitor her mood as she transitions postpartum since the change in plan may impact her in the future.   Per MOB, she experienced symptoms of depression during high school, and stated that  she was prescribed medication and participated in therapy.  MOB denied any treatment since high school, and shared belief that she experienced no lingering symptoms. She discussed impressions that talking to the FOB and her support system is an effective way to process her feelings, and shared an awareness of how this strategy may continue to be helpful as she transitions postpartum.  MOB denied any mental health concerns during the pregnancy, and overall reported feeling excited  about the transition to motherhood.  CSW provided education on normative range in emotions after birth, including the Baby Blues.  CSW continued to provide education about perinatal mood disorders. MOB and FOB denied questions or concerns related to the information, and expressed appreciation for the information.  CSW provided MOB with a check-list of common signs and symptoms that she can utilize to track her mood and to communicate her mood to her providers if she notes onset of symptoms.    MOB acknowledged ongoing CSW availability, and agreed to contact CSW if needs arise.   CSW Plan/Description:   1)Patient/Family Education: Perinatal mood disorders 2)Information/Referral to Community Resources: Feelings After Birth Support group 3)No Further Intervention Required/No Barriers to Discharge    Indie Nickerson N, LCSW 03/28/2015, 11:15 AM  

## 2015-03-28 NOTE — Discharge Summary (Signed)
OB Discharge Summary     Patient Name: Tabitha ReiningMarisa Rose DOB: Apr 16, 1986 MRN: [id number]  Date of admission: 03/26/2015 Delivering MD: Lyndel SafeNEWTON, KIMBERLY NILES   Date of discharge: 03/28/2015  Admitting diagnosis: 41 WEEKS ROM CTX  Intrauterine pregnancy: 4457w6d     Secondary diagnosis:  Principal Problem:   NSVD (normal spontaneous vaginal delivery) Active Problems:   Supervision of normal first pregnancy, antepartum   History of depression   PROM (premature rupture of membranes)   Prolonged rupture of membranes, greater than 24 hours, delivered  Additional problems: rupture of membranes greater than 24 hours, shoulder dystocia (resolved with mcroberts and woods screw maneuver)     Discharge diagnosis: Term Pregnancy Delivered                                                                                                Post partum procedures:none  Augmentation: Pitocin  Complications: shoulder dystocia as above  Hospital course:  Onset of Labor With Vaginal Delivery     29 y.o. yo G1P1001 at 8757w6d was admitted with PROM on 03/26/2015. Patient had an uncomplicated labor course as follows:  Membrane Rupture Time/Date: 2:30 AM ,03/26/2015   Intrapartum Procedures: Episiotomy: None [1]                                         Lacerations:  2nd degree [3];Perineal [11]  Patient had a delivery of a Viable infant. 03/27/2015  Information for the patient's newborn:  Sherral HammersMunoz, Girl Chelsye [[id number]][[id number]]  Delivery Method: Vaginal, Spontaneous Delivery (Filed from Delivery Summary)    Pateint had an uncomplicated postpartum course.  She is ambulating, tolerating a regular diet, passing flatus, and urinating well. Patient is discharged home in stable condition on No discharge date for patient encounter.Marland Kitchen.    Physical exam  Filed Vitals:   03/27/15 0730 03/27/15 1137 03/27/15 1812 03/28/15 0602  BP: 111/62 106/61 114/78 136/83  Pulse: 92 78 85 87  Temp: 98.2 F (36.8 C) 98 F (36.7 C)  97.5 F (36.4 C) 98.5 F (36.9 C)  TempSrc: Oral Oral Oral Oral  Resp: 18 16 18 20   Height:      Weight:      SpO2:       General: alert, cooperative and no distress Lochia: appropriate Uterine Fundus: firm Incision: N/A DVT Evaluation: No cords or calf tenderness. Labs: Lab Results  Component Value Date   WBC 15.8* 03/26/2015   HGB 13.2 03/26/2015   HCT 38.2 03/26/2015   MCV 87.6 03/26/2015   PLT 244 03/26/2015   No flowsheet data found.  Discharge instruction: per After Visit Summary and "Baby and Me Booklet".  Medications:  Current facility-administered medications:  .  acetaminophen (TYLENOL) tablet 650 mg, 650 mg, Oral, Q4H PRN, Casey BurkittHillary Moen Fitzgerald, MD .  benzocaine-Menthol (DERMOPLAST) 20-0.5 % topical spray 1 application, 1 application, Topical, PRN, Casey BurkittHillary Moen Fitzgerald, MD, 1 application at 03/27/15 [phone] .  witch hazel-glycerin (TUCKS) pad 1 application, 1 application, Topical, PRN **AND** dibucaine (  NUPERCAINAL) 1 % rectal ointment 1 application, 1 application, Rectal, PRN, Casey Burkitt, MD .  diphenhydrAMINE (BENADRYL) capsule 25 mg, 25 mg, Oral, Q6H PRN, Casey Burkitt, MD .  ibuprofen (ADVIL,MOTRIN) tablet 600 mg, 600 mg, Oral, 4 times per day, Casey Burkitt, MD, 600 mg at 03/28/15 0548 .  lanolin ointment, , Topical, PRN, Casey Burkitt, MD .  ondansetron [facility]) tablet 4 mg, 4 mg, Oral, Q4H PRN **OR** ondansetron (ZOFRAN) injection 4 mg, 4 mg, Intravenous, Q4H PRN, Casey Burkitt, MD .  oxyCODONE-acetaminophen (PERCOCET/ROXICET) 5-325 MG per tablet 1 tablet, 1 tablet, Oral, Q4H PRN, Casey Burkitt, MD .  oxyCODONE-acetaminophen (PERCOCET/ROXICET) 5-325 MG per tablet 2 tablet, 2 tablet, Oral, Q4H PRN, Casey Burkitt, MD .  prenatal multivitamin tablet 1 tablet, 1 tablet, Oral, Q1200, Casey Burkitt, MD, 1 tablet at 03/27/15 1134 .  senna-docusate (Senokot-S) tablet 2 tablet, 2  tablet, Oral, Q24H, Casey Burkitt, MD, 2 tablet at 03/28/15 0025 .  simethicone (MYLICON) chewable tablet 80 mg, 80 mg, Oral, PRN, Casey Burkitt, MD .  Tdap (BOOSTRIX) injection 0.5 mL, 0.5 mL, Intramuscular, Once, Casey Burkitt, MD .  zolpidem (AMBIEN) tablet 5 mg, 5 mg, Oral, QHS PRN, Casey Burkitt, MD  Facility-Administered Medications Ordered in Other Encounters:  .  lidocaine (PF) (XYLOCAINE) 1 % injection, , , Anesthesia Intra-op, Karie Schwalbe, MD, 4 mL at 03/26/15 0814 .  lidocaine 2 % w/epi 1:200,000 (20 ml) with sod bicarb 8.4 % (2 ml) inj, , , Continuous PRN, Cristela Blue, MD, 3 mL at 03/26/15 2259 After visit meds:    Medication List    ASK your doctor about these medications        multivitamin-prenatal 27-0.8 MG Tabs tablet  Take 1 tablet by mouth daily at 12 noon.        Diet: routine diet  Activity: Advance as tolerated. Pelvic rest for 6 weeks.   Outpatient follow up:6 weeks Follow up Appt:No future appointments. Follow up Visit:No Follow-up on file.  Postpartum contraception: Condoms  Newborn Data: Live born female  Birth Weight: 8 lb 6.2 oz (3805 g) APGAR: 8, 9  Baby Feeding: breast Disposition:home with mother   03/28/2015 Silvano Bilis, MD

## 2015-03-28 NOTE — Discharge Instructions (Signed)

## 2015-04-04 ENCOUNTER — Ambulatory Visit (INDEPENDENT_AMBULATORY_CARE_PROVIDER_SITE_OTHER): Payer: BC Managed Care – PPO | Admitting: Obstetrics & Gynecology

## 2015-04-04 ENCOUNTER — Encounter: Payer: Self-pay | Admitting: Obstetrics & Gynecology

## 2015-04-04 NOTE — Progress Notes (Signed)
   CLINIC ENCOUNTER NOTE  History:  29 y.o. G1P1001 here today reporting passing 3 large clots in 2 days per vagina since SVD on 03/27/15.  No clots noticed since 3 days ago.  Denies any heavy bleeding;  clots are noted after being sedentary for a few hours.  She denies any cramping, lightheadedness, presyncopal symptoms or other concerns.  She is breastfeeding, baby is doing well.  History reviewed. No pertinent past medical history.  Past Surgical History  Procedure Laterality Date  . Wisdom tooth extraction      The following portions of the patient's history were reviewed and updated as appropriate: allergies, current medications, past family history, past medical history, past social history, past surgical history and problem list.   Review of Systems:  Pertinent items noted in HPI and remainder of comprehensive ROS otherwise negative.  Objective:  Physical Exam BP 136/84 mmHg  Pulse 89  Wt 158 lb (71.668 kg)  Breastfeeding? Yes CONSTITUTIONAL: Well-developed, well-nourished female in no acute distress.  HENT:  Normocephalic, atraumatic. External right and left ear normal. Oropharynx is clear and moist EYES: Conjunctivae and EOM are normal. Pupils are equal, round, and reactive to light. No scleral icterus.  NECK: Normal range of motion, supple, no masses SKIN: Skin is warm and dry. No rash noted. Not diaphoretic. No erythema. No pallor. NEUROLGIC: Alert and oriented to person, place, and time. Normal reflexes, muscle tone coordination. No cranial nerve deficit noted. PSYCHIATRIC: Normal mood and affect. Normal behavior. Normal judgment and thought content. CARDIOVASCULAR: Normal heart rate noted RESPIRATORY: Effort and breath sounds normal, no problems with respiration noted ABDOMEN: Soft, no distention noted MUSCULOSKELETAL: Normal range of motion. No edema noted.   PELVIC: Normal appearing external genitalia with healing, well-approximated laceration site; normal appearing  vaginal mucosa and cervix.  Small amount of lochia in vault, small clot noted in posterior fornix.  No active bleeding noted.    Assessment & Plan:  Postpartum bleeding is within normal limits; patient reassured. Bleeding precautions reviewed. Return in about 4 weeks (around 05/02/2015) for Postpartum check.    Tabitha CollinsUGONNA  Alysia Scism, MD, FACOG Attending Obstetrician & Gynecologist, [facility]

## 2015-04-04 NOTE — Patient Instructions (Signed)
Return to clinic for any obstetric concerns or go to [facility] for evaluation  

## 2015-04-05 ENCOUNTER — Ambulatory Visit (HOSPITAL_COMMUNITY)
Admission: RE | Admit: 2015-04-05 | Discharge: 2015-04-05 | Disposition: A | Discharge status: Home / Self Care | Payer: BC Managed Care – PPO | Source: Ambulatory Visit | Attending: Obstetrics & Gynecology | Admitting: Obstetrics & Gynecology

## 2015-04-05 NOTE — Lactation Note (Signed)
Lactation Consult  Mother's reason for visit:  Mother has very painful latch mostly on left breast.  Visit Type:  Feeding assessment Appointment Notes:  Consult:  Initial Lactation Consultant:  Michel BickersKendrick, Nathaneil Feagans McCoy  ________________________________________________________________________  Joan FloresBaby's Name: Tabitha HaringStela Demario Gibson Date of Birth: 03/27/2016 Pediatrician: Dr Avis Epleyees Gender: female Gestational Age: 2337w6d (At Birth) Birth Weight: 8 lb 6.2 oz (3805 g) Weight at Discharge: Weight: 7 lb 14.1 oz (3575 g)[facility]   03/27/15 0319 03/28/15 0000 03/29/15 0025  Weight: 8 lb 6.2 oz (3805 g) 8 lb 2 oz (3685 g) 7 lb 14.1 oz (3575 g)   Weight today: 8-1.8,3680     Admission Information     Provider Service Admission Date    Chales SalmonJanet Dees, MD Newborn 03/27/2015          ADT Events       Unit Room Bed Service Event    [id number] New[facility]    03/27/15 0332 [facility] 9197 [id number] Newborn Transfer Out    03/27/15 0332 [facility] 9150 [id number] Newborn Transfer In    03/27/15 0436 [facility] 9150 [id number] Newborn Patient Update    03/29/15 1110 [facility] 9150 [id number] Newborn Discharge      Weight Information (last 3 days) before discharge     Date/Time   Weight   BSA (Calculated - sq m)   BMI (Calculated) Who      03/29/15 0025  7 lb 14.1 oz (3575 g)  --  -- OB     03/28/15 0000  8 lb 2 oz (3685 g)  --  -- Ottis StainJG     03/27/15 0319  8 lb 6.2 oz (3805 g)  0.23 sq meters  14.1 KS           Weights    Go to now       03/24/15 - Today         One Day Eight Hours  View All    10/25 10/26 10/27    Time:  0319 0000 0025    Weight   Weight  8 lb 6... 8 lb 2... 7 lb 1...  Weight       ##[id number])^[id number]^Eprivate([id number])^[id number]^20^[id number]^          ________________________________________________________________________  Mother's Name: Tabitha ReiningMarisa Rose Type of delivery:  vaginal del Breastfeeding Experience:  none Maternal Medical Conditions:  depression at 29 years old and treated times 2 years, Mother denies having any baby blues and is aware of S/S of depression Maternal Medications:  Prenatal vits and tylenol  ________________________________________________________________________  Breastfeeding History (Post Discharge)  Frequency of breastfeeding:  Every 2-3 hours Duration of feeding:  20-40 mins  Patient does not supplement or pump.  Infant Intake and Output Assessment  Voids:6-8   -in 24 hrs.  Color:  Clear yellow Stools:  4-6in 24 hrs.  Color:  Brown and Yellow  ________________________________________________________________________  Maternal Breast Assessment  Breast:  Full Nipple:  Erect, Reddened and Cracked Pain level:  6 Pain interventions:  Bra and Expressed breast milk  _______________________________________________________________________ Feeding Assessment/Evaluation:  Observed that mother has a crack on the left nipple with a small amt of blood noted on her nursing pad. Rt nipple tissue intact only slightly pink.   assist mother with latching infant on using nipple to nose latch technique. Infants lower jaw was adjusted for wider gape. Infant has a difficult time flanging upper lip . Adjusted several times . Infant sustained latch well. Mother denies having any pain  with latch . Infant sustained latch for 20 mins  Infant was offered that alternate breast and sustained good depth with audible swallows for 25 mins   Positioning:  Cross cradle Right breast  LATCH documentation:  Latch:  2 = Grasps breast easily, tongue down, lips flanged, rhythmical sucking.  Audible swallowing:  2 = Spontaneous and intermittent  Type of nipple:  2 = Everted at rest and after stimulation  Comfort  (Breast/Nipple):  1 = Filling, red/small blisters or bruises, mild/mod discomfort  Hold (Positioning):  1 = Assistance needed to correctly position infant at breast and maintain latch  LATCH score:  8  Attached assessment:  Deep  Lips flanged:  No.  Lips untucked:  Yes.    Suck assessment:  Nutritive and Nonnutritive    Pre-feed weight: 3680,8-1.8 Post-feed weight: 3704,8-2.6  Amount transferred:  24 ml    Infant's oral assessment:  Variance, Observed that infant has a tight upper lip tie that connects to the upper gum ridge. She also  has difficulty flanging upper lip. Infant also has a slight posterior tongue tie. She has lateral mobility of her tongue. When discussing tongue, MGM stated that she has a lip and a tongue tie as an adult and had both revised. Lots of discussion about tie with mother.  Mother was given  Dr Narda Bonds wedsite information and advised to review and follow up if desired.   Positioning:  Cross cradle Left breast  LATCH documentation:  Latch:  2 = Grasps breast easily, tongue down, lips flanged, rhythmical sucking.  Audible swallowing:  2 = Spontaneous and intermittent  Type of nipple:  2 = Everted at rest and after stimulation  Comfort (Breast/Nipple):  1 = Filling, red/small blisters or bruises, mild/mod discomfort  Hold (Positioning):  1 = Assistance needed to correctly position infant at breast and maintain latch  LATCH score:  8  Attached assessment:  Deep  Lips flanged:  No.  Lips untucked:  Yes.    Suck assessment:  Nutritive    Pre-feed weight: 3704 Post-feed weight:  3726 Amount transferred:22 ml    Total amount transferred: 46 ml  Advised mother to phone Dr Marice Potter for Rx to get [facility] for nipples  Continue to breastfeed infant every 2-3 hours and at least 8-12 feedings with feeding cues Mother to use nipple to nose technique with good pillow support, Advised mother to post pump her breast after feedings at least 2 times daily Mother  to follow up on November 8 at 9am for feeding assessment Phone LC as needed.

## 2015-05-02 ENCOUNTER — Ambulatory Visit (INDEPENDENT_AMBULATORY_CARE_PROVIDER_SITE_OTHER): Payer: BC Managed Care – PPO | Admitting: Obstetrics & Gynecology

## 2015-05-02 ENCOUNTER — Encounter: Payer: Self-pay | Admitting: Obstetrics & Gynecology

## 2015-05-02 NOTE — Progress Notes (Signed)
  Subjective:     Tabitha ReiningMarisa Rose is a 29 y.o. female who presents for a postpartum visit. She is 5 weeks postpartum following a spontaneous vaginal delivery. I have fully reviewed the prenatal and intrapartum course. The delivery was at 40 gestational weeks. Outcome: spontaneous vaginal delivery. Anesthesia: epidural. Postpartum course has been normal. Baby's course has been normal. Baby is feeding by breast. Bleeding no bleeding. Bowel function is normal. Bladder function is normal. Patient is not sexually active. Contraception method is condoms. Postpartum depression screening: negative.  The following portions of the patient's history were reviewed and updated as appropriate: allergies, current medications, past family history, past medical history, past social history, past surgical history and problem list.  Review of Systems Pertinent items are noted in HPI.   Objective:    BP 126/84 mmHg  Pulse 80  Resp 18  Ht 5\' 2"  (1.575 m)  Wt 158 lb (71.668 kg)  BMI 28.89 kg/m2  Breastfeeding? Yes  General:  alert   Breasts:  inspection negative, no nipple discharge or bleeding, no masses or nodularity palpable  Lungs: clear to auscultation bilaterally  Heart:  regular rate and rhythm, S1, S2 normal, no murmur, click, rub or gallop  Abdomen: soft, non-tender; bowel sounds normal; no masses,  no organomegaly   Vulva:  normal  Vagina: normal vagina  Cervix:  anteverted  Corpus: normal  Adnexa:  normal adnexa  Rectal Exam: Not performed.        Assessment:     Normal postpartum exam. Pap smear not done at today's visit.   Plan:    1. Contraception: condoms 2. We discussed ACOG recs for pap smears. (She had a normal pap 1/16 and no history of abnormal paps) 3. Follow up in: 1 year or as needed.

## 2015-05-02 NOTE — Progress Notes (Signed)
Pt is choosing to use the barrier method for birth control, may consider another form once she stops breastfeeding.

## 2015-06-07 ENCOUNTER — Ambulatory Visit (HOSPITAL_COMMUNITY)
Admission: RE | Admit: 2015-06-07 | Discharge: 2015-06-07 | Disposition: A | Discharge status: Home / Self Care | Payer: BC Managed Care – PPO | Source: Ambulatory Visit | Attending: Obstetrics & Gynecology | Admitting: Obstetrics & Gynecology

## 2015-06-07 NOTE — Lactation Note (Signed)
Lactation Consult  Mother's reason for visit: "infant not gaining weight fast" Consult:  F/u (last appt 04-05-15) Lactation Consultant:  Tabitha Rose, Tabitha Rose  ________________________________________________________________________ BW: 3805g (8# 6.2oz) 04-05-15: 8# 1.8oz Last week: 9# 6 oz Today: 9# 7oz (1478G(4280g) ________________________________________________________________________  Mother's Name: Tabitha Rose Type of delivery:  Vag Breastfeeding Experience: primip Maternal Medical Conditions:  None Maternal Medications: PNV  ________________________________________________________________________  Breastfeeding History (Post Discharge)  Frequency of breastfeeding: q2-3h except for at night. Then 5-hr span Duration of feeding: 30 min+    Pumping  Type of pump:  Medela pump in style Frequency:  After feeds Volume:  Less than 30 ml after a feed.   Mom has been pumping after feeds (she usually gets less than 1 oz total/session). If she does an hour of power-pumping, she can get a total of 2oz.   Infant Intake and Output Assessment  Voids: about 4 in 24 hrs.  Color:  Clear yellow Stools:  6 in 24 hrs.  Color:  Brown and Yellow  ________________________________________________________________________  Maternal Breast Assessment  Breast:  Soft Nipple:  Erect  _______________________________________________________________________ Feeding Assessment/Evaluation  Initial feeding assessment:  Infant's oral assessment:  WNL   Attached assessment:  Deep  Lips flanged:  Yes.     Suck assessment:  Displays both  Tools:  Supplemental nutrition system (starter) Instructed on use and cleaning of tool:  Yes.    Pre-feed weight: 4280 g Post-feed weight: 4310 g Amount transferred: 30 ml 4 latches, 20 min  Pre-feed weight: 4306g Post-feed weight: 4378 g Amount transferred: 13 ml  Amount supplemented: 59 ml Starter SNS, L breast 1340,   Total amount  transferred: 43 ml Total supplement given: 59ml  Tabitha Rose is 7510 weeks old & is only 1 lb above her BW. Tabitha Rose went to the breast and in 20 minutes had latched 4 different times (pulling off in a way consistent w/low milk supply). Tabitha Rose took 30ml during that time. She had a prolonged suck: swallow ratio during much of that feeding.    A starter SNS was added and Tabitha Rose's suck: swallow ratio immediately improved. The starter SNS was used at both breasts and Tabitha Rose drank all the formula (59ml). Tabitha Rose was content after the feeding.   Mom was shown how to clean and assemble the starter SNS. If Mom finds supplementing at the breast agreeable, she is encouraged to purchase a double SNS (as it is intended for longer-term use and some find it easier to use).   Plan: 1. Use starter SNS at breast. Put in 2 oz of formula (or EBM, if available). If Tabitha Rose wants more, allow her to have more. 2. Pump as able. 3. Mom may consider supplementing with moringa/malunggay. 4. Mom is to turn down suction on pump, as Mom finds it painful at times to pump. Mom is aware that she can also return for a flange fitting at a later time. Mom's current pump parts do not allow for changing the flanges. (Mom is using a Medela pump, but the parts are not Medela-original).   Mom is returning to the peds office for a weight check on 06-13-15.   Tabitha HewKim Gorge Almanza, RN, IBCLC

## 2017-04-13 ENCOUNTER — Telehealth: Payer: Self-pay | Admitting: *Deleted

## 2017-04-13 MED ORDER — DOXYLAMINE-PYRIDOXINE ER 20-20 MG PO TBCR: 1.0000 | EXTENDED_RELEASE_TABLET | Freq: Every day | ORAL | 6 refills | Status: DC

## 2017-04-13 NOTE — Telephone Encounter (Signed)
Pt called in wanting Diclegis for N&V in early pregnancy. Advised pt that she has private insurance and co-pay for medication is much less, possible zero, if [facility] [place]. I have samples at front desk for her to pick up while waiting on mail order pharmacy to deliver.  Pt expressed understanding.

## 2017-04-21 ENCOUNTER — Encounter: Payer: Self-pay | Admitting: *Deleted

## 2017-04-21 DIAGNOSIS — Z3480 Encounter for supervision of other normal pregnancy, unspecified trimester: Secondary | ICD-10-CM | POA: Insufficient documentation

## 2017-04-21 NOTE — Progress Notes (Signed)
DATING AND VIABILITY SONOGRAM   [name] is a 31 y.o. year old G2P1001 with LMP Patient's last menstrual period was 02/17/2017. which would correlate to  2054w0d weeks gestation.  She has regular menstrual cycles.   She is here today for a confirmatory initial sonogram.    GESTATION: SINGLETON    FETAL ACTIVITY:          Heart rate162bpm          The fetus is active.  GESTATIONAL AGE AND  BIOMETRICS:  Gestational criteria: Estimated Date of Delivery: 11/24/17 by LMP now at 6254w0d  Previous Scans:0  CROWN RUMP LENGTH           2.51cm 9.1wks                                                   AVERAGE EGA(BY THIS SCAN):  9.1 weeks  WORKING EDD( LMP ):  11/24/17     TECHNICIAN COMMENTS:  SLIUP measuring 9.1wks by CRL with FHR 162bpm   A copy of this report including all images has been saved and backed up to a second source for retrieval if needed. All measures and details of the anatomical scan, placentation, fluid volume and pelvic anatomy are contained in that report.  Mandy Hutchinson 04/21/2017 4:53 PM

## 2017-04-22 ENCOUNTER — Ambulatory Visit (INDEPENDENT_AMBULATORY_CARE_PROVIDER_SITE_OTHER): Payer: BC Managed Care – PPO | Admitting: Obstetrics and Gynecology

## 2017-04-22 ENCOUNTER — Encounter: Payer: Self-pay | Admitting: Obstetrics and Gynecology

## 2017-04-22 DIAGNOSIS — Z3480 Encounter for supervision of other normal pregnancy, unspecified trimester: Secondary | ICD-10-CM | POA: Diagnosis not present

## 2017-04-22 DIAGNOSIS — Z3481 Encounter for supervision of other normal pregnancy, first trimester: Secondary | ICD-10-CM

## 2017-04-22 DIAGNOSIS — Z1151 Encounter for screening for human papillomavirus (HPV): Secondary | ICD-10-CM

## 2017-04-22 DIAGNOSIS — Z124 Encounter for screening for malignant neoplasm of cervix: Secondary | ICD-10-CM

## 2017-04-22 DIAGNOSIS — Z113 Encounter for screening for infections with a predominantly sexual mode of transmission: Secondary | ICD-10-CM

## 2017-04-22 LAB — OB RESULTS CONSOLE GC/CHLAMYDIA: Gonorrhea: NEGATIVE

## 2017-04-22 NOTE — Progress Notes (Signed)
  Subjective:    Tabitha Rose is a G2P1001 2533w1d being seen today for her first obstetrical visit.  Her obstetrical history is significant for uncomplicated first pregnancy. Patient does intend to breast feed. Pregnancy history fully reviewed.  Patient reports nausea and vomiting well controlled with Bonjesta  Vitals:   04/22/17 1327  BP: 122/71  Pulse: 86  Weight: 124 lb (56.2 kg)    HISTORY: OB History  Gravida Para Term Preterm AB Living  2 1 1  0 0 1  SAB TAB Ectopic Multiple Live Births  0 0 0 0 1    # Outcome Date GA Lbr Len/2nd Weight Sex Delivery Anes PTL Lv  2 Current           1 Term 03/27/15 3129w6d 21:29 / 08:20 8 lb 6.2 oz (3.805 kg) F Vag-Spont EPI  LIV     History reviewed. No pertinent past medical history. Past Surgical History:  Procedure Laterality Date  . WISDOM TOOTH EXTRACTION     Family History  Problem Relation Age of Onset  . Breast cancer Mother 4543     Exam    Uterus:   10-week size  Pelvic Exam:    Perineum: No Hemorrhoids, Normal Perineum   Vulva: normal   Vagina:  normal mucosa, normal discharge   pH:    Cervix: multiparous appearance and cervix is closed and long   Adnexa: normal adnexa and no mass, fullness, tenderness   Bony Pelvis: gynecoid  System: Breast:  normal appearance, no masses or tenderness   Skin: normal coloration and turgor, no rashes    Neurologic: oriented, no focal deficits   Extremities: normal strength, tone, and muscle mass   HEENT extra ocular movement intact   Mouth/Teeth mucous membranes moist, pharynx normal without lesions and dental hygiene good   Neck supple and no masses   Cardiovascular: regular rate and rhythm   Respiratory:  chest clear, no wheezing, crepitations, rhonchi, normal symmetric air entry   Abdomen: soft, non-tender; bowel sounds normal; no masses,  no organomegaly   Urinary:       Assessment:    Pregnancy: G2P1001 Patient Active Problem List   Diagnosis Date Noted  . Supervision  of other normal pregnancy, antepartum 04/21/2017        Plan:     Initial labs drawn. Prenatal vitamins. Problem list reviewed and updated. Genetic Screening discussed : panorama next visit.  Ultrasound discussed; fetal survey: requested. Will be ordered at a later visit  Follow up in 4 weeks. 50% of 30 min visit spent on counseling and coordination of care.     Tabitha Rose 04/22/2017

## 2017-04-22 NOTE — Patient Instructions (Addendum)
 First Trimester of Pregnancy The first trimester of pregnancy is from week 1 until the end of week 13 (months 1 through 3). A week after a sperm fertilizes an egg, the egg will implant on the wall of the uterus. This embryo will begin to develop into a baby. Genes from you and your partner will form the baby. The female genes will determine whether the baby will be a boy or a girl. At 6-8 weeks, the eyes and face will be formed, and the heartbeat can be seen on ultrasound. At the end of 12 weeks, all the baby's organs will be formed. Now that you are pregnant, you will want to do everything you can to have a healthy baby. Two of the most important things are to get good prenatal care and to follow your health care provider's instructions. Prenatal care is all the medical care you receive before the baby's birth. This care will help prevent, find, and treat any problems during the pregnancy and childbirth. Body changes during your first trimester Your body goes through many changes during pregnancy. The changes vary from woman to woman.  You may gain or lose a couple of pounds at first.  You may feel sick to your stomach (nauseous) and you may throw up (vomit). If the vomiting is uncontrollable, call your health care provider.  You may tire easily.  You may develop headaches that can be relieved by medicines. All medicines should be approved by your health care provider.  You may urinate more often. Painful urination may mean you have a bladder infection.  You may develop heartburn as a result of your pregnancy.  You may develop constipation because certain hormones are causing the muscles that push stool through your intestines to slow down.  You may develop hemorrhoids or swollen veins (varicose veins).  Your breasts may begin to grow larger and become tender. Your nipples may stick out more, and the tissue that surrounds them (areola) may become darker.  Your gums may bleed and may be  sensitive to brushing and flossing.  Dark spots or blotches (chloasma, mask of pregnancy) may develop on your face. This will likely fade after the baby is born.  Your menstrual periods will stop.  You may have a loss of appetite.  You may develop cravings for certain kinds of food.  You may have changes in your emotions from day to day, such as being excited to be pregnant or being concerned that something may go wrong with the pregnancy and baby.  You may have more vivid and strange dreams.  You may have changes in your hair. These can include thickening of your hair, rapid growth, and changes in texture. Some women also have hair loss during or after pregnancy, or hair that feels dry or thin. Your hair will most likely return to normal after your baby is born.  What to expect at prenatal visits During a routine prenatal visit:  You will be weighed to make sure you and the baby are growing normally.  Your blood pressure will be taken.  Your abdomen will be measured to track your baby's growth.  The fetal heartbeat will be listened to between weeks 10 and 14 of your pregnancy.  Test results from any previous visits will be discussed.  Your health care provider may ask you:  How you are feeling.  If you are feeling the baby move.  If you have had any abnormal symptoms, such as leaking fluid, bleeding, severe   headaches, or abdominal cramping.  If you are using any tobacco products, including cigarettes, chewing tobacco, and electronic cigarettes.  If you have any questions.  Other tests that may be performed during your first trimester include:  Blood tests to find your blood type and to check for the presence of any previous infections. The tests will also be used to check for low iron levels (anemia) and protein on red blood cells (Rh antibodies). Depending on your risk factors, or if you previously had diabetes during pregnancy, you may have tests to check for high blood  sugar that affects pregnant women (gestational diabetes).  Urine tests to check for infections, diabetes, or protein in the urine.  An ultrasound to confirm the proper growth and development of the baby.  Fetal screens for spinal cord problems (spina bifida) and Down syndrome.  HIV (human immunodeficiency virus) testing. Routine prenatal testing includes screening for HIV, unless you choose not to have this test.  You may need other tests to make sure you and the baby are doing well.  Follow these instructions at home: Medicines  Follow your health care provider's instructions regarding medicine use. Specific medicines may be either safe or unsafe to take during pregnancy.  Take a prenatal vitamin that contains at least 600 micrograms (mcg) of folic acid.  If you develop constipation, try taking a stool softener if your health care provider approves. Eating and drinking  Eat a balanced diet that includes fresh fruits and vegetables, whole grains, good sources of protein such as meat, eggs, or tofu, and low-fat dairy. Your health care provider will help you determine the amount of weight gain that is right for you.  Avoid raw meat and uncooked cheese. These carry germs that can cause birth defects in the baby.  Eating four or five small meals rather than three large meals a day may help relieve nausea and vomiting. If you start to feel nauseous, eating a few soda crackers can be helpful. Drinking liquids between meals, instead of during meals, also seems to help ease nausea and vomiting.  Limit foods that are high in fat and processed sugars, such as fried and sweet foods.  To prevent constipation: ? Eat foods that are high in fiber, such as fresh fruits and vegetables, whole grains, and beans. ? Drink enough fluid to keep your urine clear or pale yellow. Activity  Exercise only as directed by your health care provider. Most women can continue their usual exercise routine during  pregnancy. Try to exercise for 30 minutes at least 5 days a week. Exercising will help you: ? Control your weight. ? Stay in shape. ? Be prepared for labor and delivery.  Experiencing pain or cramping in the lower abdomen or lower back is a good sign that you should stop exercising. Check with your health care provider before continuing with normal exercises.  Try to avoid standing for long periods of time. Move your legs often if you must stand in one place for a long time.  Avoid heavy lifting.  Wear low-heeled shoes and practice good posture.  You may continue to have sex unless your health care provider tells you not to. Relieving pain and discomfort  Wear a good support bra to relieve breast tenderness.  Take warm sitz baths to soothe any pain or discomfort caused by hemorrhoids. Use hemorrhoid cream if your health care provider approves.  Rest with your legs elevated if you have leg cramps or low back pain.  If you   develop varicose veins in your legs, wear support hose. Elevate your feet for 15 minutes, 3-4 times a day. Limit salt in your diet. Prenatal care  Schedule your prenatal visits by the twelfth week of pregnancy. They are usually scheduled monthly at first, then more often in the last 2 months before delivery.  Write down your questions. Take them to your prenatal visits.  Keep all your prenatal visits as told by your health care provider. This is important. Safety  Wear your seat belt at all times when driving.  Make a list of emergency phone numbers, including numbers for family, friends, the hospital, and police and fire departments. General instructions  Ask your health care provider for a referral to a local prenatal education class. Begin classes no later than the beginning of month 6 of your pregnancy.  Ask for help if you have counseling or nutritional needs during pregnancy. Your health care provider can offer advice or refer you to specialists for help  with various needs.  Do not use hot tubs, steam rooms, or saunas.  Do not douche or use tampons or scented sanitary pads.  Do not cross your legs for long periods of time.  Avoid cat litter boxes and soil used by cats. These carry germs that can cause birth defects in the baby and possibly loss of the fetus by miscarriage or stillbirth.  Avoid all smoking, herbs, alcohol, and medicines not prescribed by your health care provider. Chemicals in these products affect the formation and growth of the baby.  Do not use any products that contain nicotine or tobacco, such as cigarettes and e-cigarettes. If you need help quitting, ask your health care provider. You may receive counseling support and other resources to help you quit.  Schedule a dentist appointment. At home, brush your teeth with a soft toothbrush and be gentle when you floss. Contact a health care provider if:  You have dizziness.  You have mild pelvic cramps, pelvic pressure, or nagging pain in the abdominal area.  You have persistent nausea, vomiting, or diarrhea.  You have a bad smelling vaginal discharge.  You have pain when you urinate.  You notice increased swelling in your face, hands, legs, or ankles.  You are exposed to fifth disease or chickenpox.  You are exposed to German measles (rubella) and have never had it. Get help right away if:  You have a fever.  You are leaking fluid from your vagina.  You have spotting or bleeding from your vagina.  You have severe abdominal cramping or pain.  You have rapid weight gain or loss.  You vomit blood or material that looks like coffee grounds.  You develop a severe headache.  You have shortness of breath.  You have any kind of trauma, such as from a fall or a car accident. Summary  The first trimester of pregnancy is from week 1 until the end of week 13 (months 1 through 3).  Your body goes through many changes during pregnancy. The changes vary from  woman to woman.  You will have routine prenatal visits. During those visits, your health care provider will examine you, discuss any test results you may have, and talk with you about how you are feeling. This information is not intended to replace advice given to you by your health care provider. Make sure you discuss any questions you have with your health care provider. Document Released: 05/13/2001 Document Revised: 04/30/2016 Document Reviewed: 04/30/2016 Elsevier Interactive Patient Education  2017   [employer]  Contraception Choices Contraception (birth control) is the use of any methods or devices to prevent pregnancy. Below are some methods to help avoid pregnancy. Hormonal methods  Contraceptive implant. This is a thin, plastic tube containing progesterone hormone. It does not contain estrogen hormone. Your health care provider inserts the tube in the inner part of the upper arm. The tube can remain in place for up to 3 years. After 3 years, the implant must be removed. The implant prevents the ovaries from releasing an egg (ovulation), thickens the cervical mucus to prevent sperm from entering the uterus, and thins the lining of the inside of the uterus.  Progesterone-only injections. These injections are given every 3 months by your health care provider to prevent pregnancy. This synthetic progesterone hormone stops the ovaries from releasing eggs. It also thickens cervical mucus and changes the uterine lining. This makes it harder for sperm to survive in the uterus.  Birth control pills. These pills contain estrogen and progesterone hormone. They work by preventing the ovaries from releasing eggs (ovulation). They also cause the cervical mucus to thicken, preventing the sperm from entering the uterus. Birth control pills are prescribed by a health care provider.Birth control pills can also be used to treat heavy periods.  Minipill. This type of birth control pill contains only the  progesterone hormone. They are taken every day of each month and must be prescribed by your health care provider.  Birth control patch. The patch contains hormones similar to those in birth control pills. It must be changed once a week and is prescribed by a health care provider.  Vaginal ring. The ring contains hormones similar to those in birth control pills. It is left in the vagina for 3 weeks, removed for 1 week, and then a new one is put back in place. The patient must be comfortable inserting and removing the ring from the vagina.A health care provider's prescription is necessary.  Emergency contraception. Emergency contraceptives prevent pregnancy after unprotected sexual intercourse. This pill can be taken right after sex or up to 5 days after unprotected sex. It is most effective the sooner you take the pills after having sexual intercourse. Most emergency contraceptive pills are available without a prescription. Check with your pharmacist. Do not use emergency contraception as your only form of birth control. Barrier methods  Female condom. This is a thin sheath (latex or rubber) that is worn over the penis during sexual intercourse. It can be used with spermicide to increase effectiveness.  Female condom. This is a soft, loose-fitting sheath that is put into the vagina before sexual intercourse.  Diaphragm. This is a soft, latex, dome-shaped barrier that must be fitted by a health care provider. It is inserted into the vagina, along with a spermicidal jelly. It is inserted before intercourse. The diaphragm should be left in the vagina for 6 to 8 hours after intercourse.  Cervical cap. This is a round, soft, latex or plastic cup that fits over the cervix and must be fitted by a health care provider. The cap can be left in place for up to 48 hours after intercourse.  Sponge. This is a soft, circular piece of polyurethane foam. The sponge has spermicide in it. It is inserted into the vagina  after wetting it and before sexual intercourse.  Spermicides. These are chemicals that kill or block sperm from entering the cervix and uterus. They come in the form of creams, jellies, suppositories, foam, or tablets. They do not   require a prescription. They are inserted into the vagina with an applicator before having sexual intercourse. The process must be repeated every time you have sexual intercourse. Intrauterine contraception  Intrauterine device (IUD). This is a T-shaped device that is put in a woman's uterus during a menstrual period to prevent pregnancy. There are 2 types: ? Copper IUD. This type of IUD is wrapped in copper wire and is placed inside the uterus. Copper makes the uterus and fallopian tubes produce a fluid that kills sperm. It can stay in place for 10 years. ? Hormone IUD. This type of IUD contains the hormone progestin (synthetic progesterone). The hormone thickens the cervical mucus and prevents sperm from entering the uterus, and it also thins the uterine lining to prevent implantation of a fertilized egg. The hormone can weaken or kill the sperm that get into the uterus. It can stay in place for 3-5 years, depending on which type of IUD is used. Permanent methods of contraception  Female tubal ligation. This is when the woman's fallopian tubes are surgically sealed, tied, or blocked to prevent the egg from traveling to the uterus.  Hysteroscopic sterilization. This involves placing a small coil or insert into each fallopian tube. Your doctor uses a technique called hysteroscopy to do the procedure. The device causes scar tissue to form. This results in permanent blockage of the fallopian tubes, so the sperm cannot fertilize the egg. It takes about 3 months after the procedure for the tubes to become blocked. You must use another form of birth control for these 3 months.  Female sterilization. This is when the female has the tubes that carry sperm tied off (vasectomy).This  blocks sperm from entering the vagina during sexual intercourse. After the procedure, the man can still ejaculate fluid (semen). Natural planning methods  Natural family planning. This is not having sexual intercourse or using a barrier method (condom, diaphragm, cervical cap) on days the woman could become pregnant.  Calendar method. This is keeping track of the length of each menstrual cycle and identifying when you are fertile.  Ovulation method. This is avoiding sexual intercourse during ovulation.  Symptothermal method. This is avoiding sexual intercourse during ovulation, using a thermometer and ovulation symptoms.  Post-ovulation method. This is timing sexual intercourse after you have ovulated. Regardless of which type or method of contraception you choose, it is important that you use condoms to protect against the transmission of sexually transmitted infections (STIs). Talk with your health care provider about which form of contraception is most appropriate for you. This information is not intended to replace advice given to you by your health care provider. Make sure you discuss any questions you have with your health care provider. Document Released: 05/19/2005 Document Revised: 10/25/2015 Document Reviewed: 11/11/2012 Elsevier Interactive Patient Education  2017 [employer]   Breastfeeding Deciding to breastfeed is one of the best choices you can make for you and your baby. A change in hormones during pregnancy causes your breast tissue to grow and increases the number and size of your milk ducts. These hormones also allow proteins, sugars, and fats from your blood supply to make breast milk in your milk-producing glands. Hormones prevent breast milk from being released before your baby is born as well as prompt milk flow after birth. Once breastfeeding has begun, thoughts of your baby, as well as his or her sucking or crying, can stimulate the release of milk from your  milk-producing glands. Benefits of breastfeeding For Your Baby  Your   first milk (colostrum) helps your baby's digestive system function better.  There are antibodies in your milk that help your baby fight off infections.  Your baby has a lower incidence of asthma, allergies, and sudden infant death syndrome.  The nutrients in breast milk are better for your baby than infant formulas and are designed uniquely for your baby's needs.  Breast milk improves your baby's brain development.  Your baby is less likely to develop other conditions, such as childhood obesity, asthma, or type 2 diabetes mellitus.  For You  Breastfeeding helps to create a very special bond between you and your baby.  Breastfeeding is convenient. Breast milk is always available at the correct temperature and costs nothing.  Breastfeeding helps to burn calories and helps you lose the weight gained during pregnancy.  Breastfeeding makes your uterus contract to its prepregnancy size faster and slows bleeding (lochia) after you give birth.  Breastfeeding helps to lower your risk of developing type 2 diabetes mellitus, osteoporosis, and breast or ovarian cancer later in life.  Signs that your baby is hungry Early Signs of Hunger  Increased alertness or activity.  Stretching.  Movement of the head from side to side.  Movement of the head and opening of the mouth when the corner of the mouth or cheek is stroked (rooting).  Increased sucking sounds, smacking lips, cooing, sighing, or squeaking.  Hand-to-mouth movements.  Increased sucking of fingers or hands.  Late Signs of Hunger  Fussing.  Intermittent crying.  Extreme Signs of Hunger Signs of extreme hunger will require calming and consoling before your baby will be able to breastfeed successfully. Do not wait for the following signs of extreme hunger to occur before you initiate breastfeeding:  Restlessness.  A loud, strong  cry.  Screaming.  Breastfeeding basics Breastfeeding Initiation  Find a comfortable place to sit or lie down, with your neck and back well supported.  Place a pillow or rolled up blanket under your baby to bring him or her to the level of your breast (if you are seated). Nursing pillows are specially designed to help support your arms and your baby while you breastfeed.  Make sure that your baby's abdomen is facing your abdomen.  Gently massage your breast. With your fingertips, massage from your chest wall toward your nipple in a circular motion. This encourages milk flow. You may need to continue this action during the feeding if your milk flows slowly.  Support your breast with 4 fingers underneath and your thumb above your nipple. Make sure your fingers are well away from your nipple and your baby's mouth.  Stroke your baby's lips gently with your finger or nipple.  When your baby's mouth is open wide enough, quickly bring your baby to your breast, placing your entire nipple and as much of the colored area around your nipple (areola) as possible into your baby's mouth. ? More areola should be visible above your baby's upper lip than below the lower lip. ? Your baby's tongue should be between his or her lower gum and your breast.  Ensure that your baby's mouth is correctly positioned around your nipple (latched). Your baby's lips should create a seal on your breast and be turned out (everted).  It is common for your baby to suck about 2-3 minutes in order to start the flow of breast milk.  Latching Teaching your baby how to latch on to your breast properly is very important. An improper latch can cause nipple pain and decreased   milk supply for you and poor weight gain in your baby. Also, if your baby is not latched onto your nipple properly, he or she may swallow some air during feeding. This can make your baby fussy. Burping your baby when you switch breasts during the feeding can  help to get rid of the air. However, teaching your baby to latch on properly is still the best way to prevent fussiness from swallowing air while breastfeeding. Signs that your baby has successfully latched on to your nipple:  Silent tugging or silent sucking, without causing you pain.  Swallowing heard between every 3-4 sucks.  Muscle movement above and in front of his or her ears while sucking.  Signs that your baby has not successfully latched on to nipple:  Sucking sounds or smacking sounds from your baby while breastfeeding.  Nipple pain.  If you think your baby has not latched on correctly, slip your finger into the corner of your baby's mouth to break the suction and place it between your baby's gums. Attempt breastfeeding initiation again. Signs of Successful Breastfeeding Signs from your baby:  A gradual decrease in the number of sucks or complete cessation of sucking.  Falling asleep.  Relaxation of his or her body.  Retention of a small amount of milk in his or her mouth.  Letting go of your breast by himself or herself.  Signs from you:  Breasts that have increased in firmness, weight, and size 1-3 hours after feeding.  Breasts that are softer immediately after breastfeeding.  Increased milk volume, as well as a change in milk consistency and color by the fifth day of breastfeeding.  Nipples that are not sore, cracked, or bleeding.  Signs That Your Baby is Getting Enough Milk  Wetting at least 1-2 diapers during the first 24 hours after birth.  Wetting at least 5-6 diapers every 24 hours for the first week after birth. The urine should be clear or pale yellow by 5 days after birth.  Wetting 6-8 diapers every 24 hours as your baby continues to grow and develop.  At least 3 stools in a 24-hour period by age 5 days. The stool should be soft and yellow.  At least 3 stools in a 24-hour period by age 7 days. The stool should be seedy and yellow.  No loss of  weight greater than 10% of birth weight during the first 3 days of age.  Average weight gain of 4-7 ounces (113-198 g) per week after age 4 days.  Consistent daily weight gain by age 5 days, without weight loss after the age of 2 weeks.  After a feeding, your baby may spit up a small amount. This is common. Breastfeeding frequency and duration Frequent feeding will help you make more milk and can prevent sore nipples and breast engorgement. Breastfeed when you feel the need to reduce the fullness of your breasts or when your baby shows signs of hunger. This is called "breastfeeding on demand." Avoid introducing a pacifier to your baby while you are working to establish breastfeeding (the first 4-6 weeks after your baby is born). After this time you may choose to use a pacifier. Research has shown that pacifier use during the first year of a baby's life decreases the risk of sudden infant death syndrome (SIDS). Allow your baby to feed on each breast as long as he or she wants. Breastfeed until your baby is finished feeding. When your baby unlatches or falls asleep while feeding from the first   breast, offer the second breast. Because newborns are often sleepy in the first few weeks of life, you may need to awaken your baby to get him or her to feed. Breastfeeding times will vary from baby to baby. However, the following rules can serve as a guide to help you ensure that your baby is properly fed:  Newborns (babies 4 weeks of age or younger) may breastfeed every 1-3 hours.  Newborns should not go longer than 3 hours during the day or 5 hours during the night without breastfeeding.  You should breastfeed your baby a minimum of 8 times in a 24-hour period until you begin to introduce solid foods to your baby at around 6 months of age.  Breast milk pumping Pumping and storing breast milk allows you to ensure that your baby is exclusively fed your breast milk, even at times when you are unable to  breastfeed. This is especially important if you are going back to work while you are still breastfeeding or when you are not able to be present during feedings. Your lactation consultant can give you guidelines on how long it is safe to store breast milk. A breast pump is a machine that allows you to pump milk from your breast into a sterile bottle. The pumped breast milk can then be stored in a refrigerator or freezer. Some breast pumps are operated by hand, while others use electricity. Ask your lactation consultant which type will work best for you. Breast pumps can be purchased, but some hospitals and breastfeeding support groups lease breast pumps on a monthly basis. A lactation consultant can teach you how to hand express breast milk, if you prefer not to use a pump. Caring for your breasts while you breastfeed Nipples can become dry, cracked, and sore while breastfeeding. The following recommendations can help keep your breasts moisturized and healthy:  Avoid using soap on your nipples.  Wear a supportive bra. Although not required, special nursing bras and tank tops are designed to allow access to your breasts for breastfeeding without taking off your entire bra or top. Avoid wearing underwire-style bras or extremely tight bras.  Air dry your nipples for 3-4minutes after each feeding.  Use only cotton bra pads to absorb leaked breast milk. Leaking of breast milk between feedings is normal.  Use lanolin on your nipples after breastfeeding. Lanolin helps to maintain your skin's normal moisture barrier. If you use pure lanolin, you do not need to wash it off before feeding your baby again. Pure lanolin is not toxic to your baby. You may also hand express a few drops of breast milk and gently massage that milk into your nipples and allow the milk to air dry.  In the first few weeks after giving birth, some women experience extremely full breasts (engorgement). Engorgement can make your breasts  feel heavy, warm, and tender to the touch. Engorgement peaks within 3-5 days after you give birth. The following recommendations can help ease engorgement:  Completely empty your breasts while breastfeeding or pumping. You may want to start by applying warm, moist heat (in the shower or with warm water-soaked hand towels) just before feeding or pumping. This increases circulation and helps the milk flow. If your baby does not completely empty your breasts while breastfeeding, pump any extra milk after he or she is finished.  Wear a snug bra (nursing or regular) or tank top for 1-2 days to signal your body to slightly decrease milk production.  Apply ice packs to   your breasts, unless this is too uncomfortable for you.  Make sure that your baby is latched on and positioned properly while breastfeeding.  If engorgement persists after 48 hours of following these recommendations, contact your health care provider or a lactation consultant. Overall health care recommendations while breastfeeding  Eat healthy foods. Alternate between meals and snacks, eating 3 of each per day. Because what you eat affects your breast milk, some of the foods may make your baby more irritable than usual. Avoid eating these foods if you are sure that they are negatively affecting your baby.  Drink milk, fruit juice, and water to satisfy your thirst (about 10 glasses a day).  Rest often, relax, and continue to take your prenatal vitamins to prevent fatigue, stress, and anemia.  Continue breast self-awareness checks.  Avoid chewing and smoking tobacco. Chemicals from cigarettes that pass into breast milk and exposure to secondhand smoke may harm your baby.  Avoid alcohol and drug use, including marijuana. Some medicines that may be harmful to your baby can pass through breast milk. It is important to ask your health care provider before taking any medicine, including all over-the-counter and prescription medicine as well  as vitamin and herbal supplements. It is possible to become pregnant while breastfeeding. If birth control is desired, ask your health care provider about options that will be safe for your baby. Contact a health care provider if:  You feel like you want to stop breastfeeding or have become frustrated with breastfeeding.  You have painful breasts or nipples.  Your nipples are cracked or bleeding.  Your breasts are red, tender, or warm.  You have a swollen area on either breast.  You have a fever or chills.  You have nausea or vomiting.  You have drainage other than breast milk from your nipples.  Your breasts do not become full before feedings by the fifth day after you give birth.  You feel sad and depressed.  Your baby is too sleepy to eat well.  Your baby is having trouble sleeping.  Your baby is wetting less than 3 diapers in a 24-hour period.  Your baby has less than 3 stools in a 24-hour period.  Your baby's skin or the white part of his or her eyes becomes yellow.  Your baby is not gaining weight by 5 days of age. Get help right away if:  Your baby is overly tired (lethargic) and does not want to wake up and feed.  Your baby develops an unexplained fever. This information is not intended to replace advice given to you by your health care provider. Make sure you discuss any questions you have with your health care provider. Document Released: 05/19/2005 Document Revised: 10/31/2015 Document Reviewed: 11/10/2012 Elsevier Interactive Patient Education  2017 Elsevier Inc.  

## 2017-04-23 LAB — OBSTETRIC PANEL, INCLUDING HIV
ANTIBODY SCREEN: NEGATIVE
BASOS: 0 %
Basophils Absolute: 0 10*3/uL (ref 0.0–0.2)
EOS (ABSOLUTE): 0.1 10*3/uL (ref 0.0–0.4)
EOS: 1 %
HEMATOCRIT: 36.9 % (ref 34.0–46.6)
HEMOGLOBIN: 12.9 g/dL (ref 11.1–15.9)
HIV SCREEN 4TH GENERATION: NONREACTIVE
Hepatitis B Surface Ag: NEGATIVE
IMMATURE GRANS (ABS): 0 10*3/uL (ref 0.0–0.1)
Immature Granulocytes: 0 %
LYMPHS ABS: 2.5 10*3/uL (ref 0.7–3.1)
Lymphs: 27 %
MCH: 30.1 pg (ref 26.6–33.0)
MCHC: 35 g/dL (ref 31.5–35.7)
MCV: 86 fL (ref 79–97)
MONOS ABS: 0.6 10*3/uL (ref 0.1–0.9)
Monocytes: 6 %
NEUTROS ABS: 6.2 10*3/uL (ref 1.4–7.0)
Neutrophils: 66 %
Platelets: 320 10*3/uL (ref 150–379)
RBC: 4.28 x10E6/uL (ref 3.77–5.28)
RDW: 13.5 % (ref 12.3–15.4)
RH TYPE: POSITIVE
RPR Ser Ql: NONREACTIVE
Rubella Antibodies, IGG: 3.24 index (ref >0.99)
WBC: 9.5 10*3/uL (ref 3.4–10.8)

## 2017-04-24 LAB — GC/CHLAMYDIA PROBE AMP (~~LOC~~) NOT AT ARMC
Chlamydia: NEGATIVE
NEISSERIA GONORRHEA: NEGATIVE

## 2017-04-27 LAB — CYTOLOGY - PAP
Diagnosis: NEGATIVE
HPV: NOT DETECTED

## 2017-04-29 DIAGNOSIS — Z3480 Encounter for supervision of other normal pregnancy, unspecified trimester: Secondary | ICD-10-CM

## 2017-05-19 ENCOUNTER — Ambulatory Visit (INDEPENDENT_AMBULATORY_CARE_PROVIDER_SITE_OTHER): Payer: BLUE CROSS/BLUE SHIELD | Admitting: Obstetrics and Gynecology

## 2017-05-19 ENCOUNTER — Encounter: Payer: Self-pay | Admitting: Obstetrics and Gynecology

## 2017-05-19 DIAGNOSIS — Z3480 Encounter for supervision of other normal pregnancy, unspecified trimester: Secondary | ICD-10-CM

## 2017-05-19 NOTE — Progress Notes (Signed)
Prenatal Visit Note Date: 05/19/2017 Clinic: [facility]  Subjective:  Tabitha Rose is a 31 y.o. G2P1001 at 5972w0d being seen today for ongoing prenatal care.  She is currently monitored for the following issues for this low-risk pregnancy and has Supervision of other normal pregnancy, antepartum on their problem list.  Patient reports no complaints.   Contractions: Not present. Vag. Bleeding: None.  Movement: Absent. Denies leaking of fluid.   The following portions of the patient's history were reviewed and updated as appropriate: allergies, current medications, past family history, past medical history, past social history, past surgical history and problem list. Problem list updated.  Objective:   Vitals:   05/19/17 1121  BP: 108/74  Pulse: 84  Weight: 129 lb (58.5 kg)    Fetal Status: Fetal Heart Rate (bpm): 145   Movement: Absent     General:  Alert, oriented and cooperative. Patient is in no acute distress.  Skin: Skin is warm and dry. No rash noted.   Cardiovascular: Normal heart rate noted  Respiratory: Normal respiratory effort, no problems with respiration noted  Abdomen: Soft, gravid, appropriate for gestational age. Pain/Pressure: Absent     Pelvic:  Cervical exam deferred        Extremities: Normal range of motion.  Edema: None  Mental Status: Normal mood and affect. Normal behavior. Normal judgment and thought content.   Urinalysis:      Assessment and Plan:  Pregnancy: G2P1001 at 6072w0d  1. Supervision of other normal pregnancy, antepartum - US MFM OB COMP + 14 WK; Future - Genetic Screening - Culture, OB Urine  Preterm labor symptoms and general obstetric precautions including but not limited to vaginal bleeding, contractions, leaking of fluid and fetal movement were reviewed in detail with the patient. Please refer to After Visit Summary for other counseling recommendations.  No Follow-up on file. per baby scripts   [place] BingPickens, Charika Mikelson,  MD

## 2017-05-21 LAB — URINE CULTURE, OB REFLEX

## 2017-05-21 LAB — CULTURE, OB URINE

## 2017-06-02 NOTE — L&D Delivery Note (Signed)
Patient is 32 y.o. G2P1001 6158w1d admitted for IOL for postdates. S/p IOL with foley bulb, followed by Pitocin. AROM at 1832. Prenatal course also complicated by none.  GBS neg  Delivery Note At 8:52 PM a viable female was delivered via Vaginal, Spontaneous (Presentation: ROA ).  APGAR: 4, 9; weight pending.   Placenta status: Intact.  Cord: 3V with the following complications: Tight nuchal; delivery through.  Cord pH: 7.2  Anesthesia: Epidural Episiotomy: None Lacerations: None Est. Blood Loss (mL): 150  Mom to postpartum.  Baby to Couplet care / Skin to Skin.  Tabitha AdaJazma Loletha Bertini, DO 11/25/2017, 9:07 PM OB Fellow [facility]

## 2017-06-03 ENCOUNTER — Telehealth: Payer: Self-pay | Admitting: Radiology

## 2017-06-03 ENCOUNTER — Encounter: Payer: Self-pay | Admitting: *Deleted

## 2017-06-03 ENCOUNTER — Encounter: Payer: Self-pay | Admitting: Radiology

## 2017-06-03 NOTE — Telephone Encounter (Signed)
Patient informed of [facility] Prenatal Test results

## 2017-06-24 ENCOUNTER — Encounter (HOSPITAL_COMMUNITY): Payer: Self-pay | Admitting: Obstetrics and Gynecology

## 2017-06-30 ENCOUNTER — Other Ambulatory Visit: Payer: Self-pay | Admitting: Obstetrics and Gynecology

## 2017-06-30 ENCOUNTER — Other Ambulatory Visit (HOSPITAL_COMMUNITY): Payer: Self-pay | Admitting: *Deleted

## 2017-06-30 ENCOUNTER — Ambulatory Visit (HOSPITAL_COMMUNITY)
Admission: RE | Admit: 2017-06-30 | Discharge: 2017-06-30 | Disposition: A | Discharge status: Home / Self Care | Payer: BLUE CROSS/BLUE SHIELD | Source: Ambulatory Visit | Attending: Obstetrics and Gynecology | Admitting: Obstetrics and Gynecology

## 2017-06-30 DIAGNOSIS — O350XX0 Maternal care for (suspected) central nervous system malformation in fetus, not applicable or unspecified: Secondary | ICD-10-CM | POA: Diagnosis not present

## 2017-06-30 DIAGNOSIS — IMO0001 Reserved for inherently not codable concepts without codable children: Secondary | ICD-10-CM

## 2017-06-30 DIAGNOSIS — Z3480 Encounter for supervision of other normal pregnancy, unspecified trimester: Secondary | ICD-10-CM

## 2017-06-30 DIAGNOSIS — G930 Cerebral cysts: Secondary | ICD-10-CM | POA: Diagnosis not present

## 2017-06-30 DIAGNOSIS — O9989 Other specified diseases and conditions complicating pregnancy, childbirth and the puerperium: Secondary | ICD-10-CM | POA: Diagnosis not present

## 2017-06-30 DIAGNOSIS — Z3A19 19 weeks gestation of pregnancy: Secondary | ICD-10-CM | POA: Insufficient documentation

## 2017-06-30 DIAGNOSIS — Z3689 Encounter for other specified antenatal screening: Secondary | ICD-10-CM

## 2017-06-30 NOTE — Addendum Note (Signed)
Encounter addended by: Lenise ArenaBazemore, Tae Robak, RDMS on: 06/30/2017 11:53 AM  Actions taken: Imaging Exam ended

## 2017-07-01 ENCOUNTER — Encounter: Payer: Self-pay | Admitting: Obstetrics and Gynecology

## 2017-07-01 DIAGNOSIS — O283 Abnormal ultrasonic finding on antenatal screening of mother: Secondary | ICD-10-CM | POA: Insufficient documentation

## 2017-07-07 ENCOUNTER — Encounter: Payer: Self-pay | Admitting: Obstetrics and Gynecology

## 2017-07-07 ENCOUNTER — Ambulatory Visit (INDEPENDENT_AMBULATORY_CARE_PROVIDER_SITE_OTHER): Payer: BLUE CROSS/BLUE SHIELD | Admitting: Obstetrics and Gynecology

## 2017-07-07 VITALS — BP 129/79 | HR 84 | Wt 135.0 lb

## 2017-07-07 DIAGNOSIS — Z3480 Encounter for supervision of other normal pregnancy, unspecified trimester: Secondary | ICD-10-CM

## 2017-07-07 NOTE — Progress Notes (Signed)
Prenatal Visit Note Date: 07/07/2017 Clinic: [facility]  Subjective:  Tabitha Rose is a 32 y.o. G2P1001 at [redacted]w[redacted]d being seen today for ongoing prenatal care.  She is currently monitored for the following issues for this low-risk pregnancy and has Supervision of other normal pregnancy, antepartum and Abnormal fetal ultrasound on their problem list.  Patient reports cold s/s.   Contractions: Not present. Vag. Bleeding: None.  Movement: Present. Denies leaking of fluid.   The following portions of the patient's history were reviewed and updated as appropriate: allergies, current medications, past family history, past medical history, past social history, past surgical history and problem list. Problem list updated.  Objective:   Vitals:   07/07/17 1000  BP: 129/79  Pulse: 84  Weight: 135 lb (61.2 kg)    Fetal Status: Fetal Heart Rate (bpm): 145   Movement: Present     General:  Alert, oriented and cooperative. Patient is in no acute distress.  Skin: Skin is warm and dry. No rash noted.   Cardiovascular: Normal heart rate noted  Respiratory: Normal respiratory effort, no problems with respiration noted  Abdomen: Soft, gravid, appropriate for gestational age. Pain/Pressure: Absent     Pelvic:  Cervical exam deferred        Extremities: Normal range of motion.  Edema: None  Mental Status: Normal mood and affect. Normal behavior. Normal judgment and thought content.   Urinalysis:      Assessment and Plan:  Pregnancy: G2P1001 at [redacted]w[redacted]d  1. Supervision of other normal pregnancy, antepartum Routine care. Urgent care precautions given and a list of safe OTCs  Preterm labor symptoms and general obstetric precautions including but not limited to vaginal bleeding, contractions, leaking of fluid and fetal movement were reviewed in detail with the patient. Please refer to After Visit Summary for other counseling recommendations.  Return in about 4 weeks (around  08/04/2017).   [place] BingPickens, Tabitha Brabson, MD

## 2017-07-30 ENCOUNTER — Ambulatory Visit (HOSPITAL_COMMUNITY): Payer: BLUE CROSS/BLUE SHIELD

## 2017-07-31 ENCOUNTER — Other Ambulatory Visit (HOSPITAL_COMMUNITY): Payer: Self-pay | Admitting: Obstetrics and Gynecology

## 2017-07-31 ENCOUNTER — Ambulatory Visit (HOSPITAL_COMMUNITY)
Admission: RE | Admit: 2017-07-31 | Discharge: 2017-07-31 | Disposition: A | Discharge status: Home / Self Care | Payer: BLUE CROSS/BLUE SHIELD | Source: Ambulatory Visit | Attending: Obstetrics and Gynecology | Admitting: Obstetrics and Gynecology

## 2017-07-31 DIAGNOSIS — IMO0001 Reserved for inherently not codable concepts without codable children: Secondary | ICD-10-CM

## 2017-07-31 DIAGNOSIS — Z362 Encounter for other antenatal screening follow-up: Secondary | ICD-10-CM | POA: Insufficient documentation

## 2017-07-31 DIAGNOSIS — Z3A23 23 weeks gestation of pregnancy: Secondary | ICD-10-CM

## 2017-07-31 DIAGNOSIS — O350XX0 Maternal care for (suspected) central nervous system malformation in fetus, not applicable or unspecified: Secondary | ICD-10-CM | POA: Diagnosis not present

## 2017-08-28 ENCOUNTER — Ambulatory Visit (INDEPENDENT_AMBULATORY_CARE_PROVIDER_SITE_OTHER): Payer: BLUE CROSS/BLUE SHIELD | Admitting: Obstetrics & Gynecology

## 2017-08-28 VITALS — BP 103/79 | HR 78 | Wt 159.8 lb

## 2017-08-28 DIAGNOSIS — Z3402 Encounter for supervision of normal first pregnancy, second trimester: Secondary | ICD-10-CM

## 2017-08-28 DIAGNOSIS — Z23 Encounter for immunization: Secondary | ICD-10-CM

## 2017-08-28 NOTE — Progress Notes (Signed)
   PRENATAL VISIT NOTE  Subjective:  Tabitha Rose is a 32 y.o. G2P1001 at 2330w3d being seen today for ongoing prenatal care.  She is currently monitored for the following issues for this low-risk pregnancy and has Supervision of other normal pregnancy, antepartum and Abnormal fetal ultrasound on their problem list.  Patient reports no complaints.  Contractions: Not present.  .  Movement: Present. Denies leaking of fluid.   The following portions of the patient's history were reviewed and updated as appropriate: allergies, current medications, past family history, past medical history, past social history, past surgical history and problem list. Problem list updated.  Objective:   Vitals:   08/28/17 0852  BP: 103/79  Pulse: 78  Weight: 159 lb 12.8 oz (72.5 kg)    Fetal Status: Fetal Heart Rate (bpm): 138   Movement: Present     General:  Alert, oriented and cooperative. Patient is in no acute distress.  Skin: Skin is warm and dry. No rash noted.   Cardiovascular: Normal heart rate noted  Respiratory: Normal respiratory effort, no problems with respiration noted  Abdomen: Soft, gravid, appropriate for gestational age.  Pain/Pressure: Absent     Pelvic: Cervical exam deferred        Extremities: Normal range of motion.  Edema: None  Mental Status:  Normal mood and affect. Normal behavior. Normal judgment and thought content.   Assessment and Plan:  Pregnancy: G2P1001 at 1530w3d  1. Encounter for supervision of normal first pregnancy in second trimester - TDAP - RPR - HIV antibody - Glucose Tolerance, 2 Hours w/1 Hour - CBC  Preterm labor symptoms and general obstetric precautions including but not limited to vaginal bleeding, contractions, leaking of fluid and fetal movement were reviewed in detail with the patient. Please refer to After Visit Summary for other counseling recommendations.  Return in about 3 weeks (around 09/18/2017).   Allie BossierMyra C Berlyn Saylor, MD

## 2017-08-28 NOTE — Progress Notes (Signed)
ror 

## 2017-08-29 LAB — CBC
Hematocrit: 31.8 % — ABNORMAL LOW (ref 34.0–46.6)
Hemoglobin: 11.2 g/dL (ref 11.1–15.9)
MCH: 30.5 pg (ref 26.6–33.0)
MCHC: 35.2 g/dL (ref 31.5–35.7)
MCV: 87 fL (ref 79–97)
PLATELETS: 256 10*3/uL (ref 150–379)
RBC: 3.67 x10E6/uL — AB (ref 3.77–5.28)
RDW: 13 % (ref 12.3–15.4)
WBC: 8.8 10*3/uL (ref 3.4–10.8)

## 2017-08-29 LAB — GLUCOSE TOLERANCE, 2 HOURS W/ 1HR
GLUCOSE, 1 HOUR: 115 mg/dL (ref 65–179)
GLUCOSE, 2 HOUR: 107 mg/dL (ref 65–152)
GLUCOSE, FASTING: 80 mg/dL (ref 65–91)

## 2017-08-29 LAB — RPR: RPR Ser Ql: NONREACTIVE

## 2017-08-29 LAB — HIV ANTIBODY (ROUTINE TESTING W REFLEX): HIV Screen 4th Generation wRfx: NONREACTIVE

## 2017-09-24 ENCOUNTER — Encounter: Payer: Self-pay | Admitting: Obstetrics & Gynecology

## 2017-09-24 ENCOUNTER — Ambulatory Visit (INDEPENDENT_AMBULATORY_CARE_PROVIDER_SITE_OTHER): Payer: BLUE CROSS/BLUE SHIELD | Admitting: Obstetrics & Gynecology

## 2017-09-24 VITALS — BP 113/74 | HR 114 | Wt 153.0 lb

## 2017-09-24 DIAGNOSIS — Z3480 Encounter for supervision of other normal pregnancy, unspecified trimester: Secondary | ICD-10-CM

## 2017-09-24 DIAGNOSIS — Z3483 Encounter for supervision of other normal pregnancy, third trimester: Secondary | ICD-10-CM

## 2017-09-24 NOTE — Progress Notes (Signed)
   PRENATAL VISIT NOTE  Subjective:  Tabitha Rose is a 32 y.o. G2P1001 at 7627w2d being seen today for ongoing prenatal care.  She is currently monitored for the following issues for this low-risk pregnancy and has Supervision of other normal pregnancy, antepartum and Abnormal fetal ultrasound on their problem list.  Patient reports no complaints.  Contractions: Not present. Vag. Bleeding: None.  Movement: Present. Denies leaking of fluid.   The following portions of the patient's history were reviewed and updated as appropriate: allergies, current medications, past family history, past medical history, past social history, past surgical history and problem list. Problem list updated.  Objective:   Vitals:   09/24/17 1018  BP: 113/74  Pulse: (!) 114  Weight: 153 lb (69.4 kg)    Fetal Status: Fetal Heart Rate (bpm): 150   Movement: Present     General:  Alert, oriented and cooperative. Patient is in no acute distress.  Skin: Skin is warm and dry. No rash noted.   Cardiovascular: Normal heart rate noted  Respiratory: Normal respiratory effort, no problems with respiration noted  Abdomen: Soft, gravid, appropriate for gestational age.  Pain/Pressure: Present     Pelvic: Cervical exam deferred        Extremities: Normal range of motion.  Edema: None  Mental Status: Normal mood and affect. Normal behavior. Normal judgment and thought content.   Assessment and Plan:  Pregnancy: G2P1001 at 3927w2d  1. Supervision of other normal pregnancy, antepartum   Preterm labor symptoms and general obstetric precautions including but not limited to vaginal bleeding, contractions, leaking of fluid and fetal movement were reviewed in detail with the patient. Please refer to After Visit Summary for other counseling recommendations.  Return in about 1 month (around 10/22/2017).  Future Appointments  Date Time Provider Department Center  10/20/2017  9:45 AM Anyanwu, Jethro BastosUgonna A, MD [facility]     Allie BossierMyra C Tico Crotteau, MD

## 2017-10-20 ENCOUNTER — Ambulatory Visit (INDEPENDENT_AMBULATORY_CARE_PROVIDER_SITE_OTHER): Payer: BLUE CROSS/BLUE SHIELD | Admitting: Obstetrics & Gynecology

## 2017-10-20 ENCOUNTER — Encounter: Payer: BLUE CROSS/BLUE SHIELD | Admitting: Obstetrics & Gynecology

## 2017-10-20 VITALS — BP 126/82 | HR 92 | Wt 158.4 lb

## 2017-10-20 DIAGNOSIS — Z3480 Encounter for supervision of other normal pregnancy, unspecified trimester: Secondary | ICD-10-CM | POA: Diagnosis not present

## 2017-10-20 DIAGNOSIS — Z3493 Encounter for supervision of normal pregnancy, unspecified, third trimester: Secondary | ICD-10-CM | POA: Diagnosis not present

## 2017-10-20 DIAGNOSIS — Z3483 Encounter for supervision of other normal pregnancy, third trimester: Secondary | ICD-10-CM

## 2017-10-20 NOTE — Progress Notes (Signed)
   PRENATAL VISIT NOTE  Subjective:  Tabitha Rose is a 32 y.o. G2P1001 at [redacted]w[redacted]d being seen today for ongoing prenatal care.  She is currently monitored for the following issues for this low-risk pregnancy and has Supervision of other normal pregnancy, antepartum on their problem list.  Patient reports no complaints.  Contractions: Not present. Vag. Bleeding: None.  Movement: Present. Denies leaking of fluid.   The following portions of the patient's history were reviewed and updated as appropriate: allergies, current medications, past family history, past medical history, past social history, past surgical history and problem list. Problem list updated.  Objective:   Vitals:   10/20/17 0955  BP: 126/82  Pulse: 92  Weight: 158 lb 6.4 oz (71.8 kg)    Fetal Status: Fetal Heart Rate (bpm): 142 Fundal Height: 36 cm Movement: Present  Presentation: Vertex by ultasound  General:  Alert, oriented and cooperative. Patient is in no acute distress.  Skin: Skin is warm and dry. No rash noted.   Cardiovascular: Normal heart rate noted  Respiratory: Normal respiratory effort, no problems with respiration noted  Abdomen: Soft, gravid, appropriate for gestational age.  Pain/Pressure: Present     Pelvic: Cervical exam performed Dilation: 1 Effacement (%): Thick Station: Ballotable  Extremities: Normal range of motion.  Edema: None  Mental Status: Normal mood and affect. Normal behavior. Normal judgment and thought content.   Assessment and Plan:  Pregnancy: G2P1001 at [redacted]w[redacted]d  1. Supervision of other normal pregnancy, antepartum Pelvic cultures done today. - Strep Gp B NAA - Cervicovaginal ancillary only  Preterm labor symptoms and general obstetric precautions including but not limited to vaginal bleeding, contractions, leaking of fluid and fetal movement were reviewed in detail with the patient. Please refer to After Visit Summary for other counseling recommendations.  Return in about 2 weeks  (around 11/03/2017) for OB 37 week visit (Babyscripts).  Future Appointments  Date Time Provider Department Center  11/03/2017  9:45 AM Reva Bores, MD [facility]  11/10/2017  9:45 AM Macon Large, Jethro Bastos, MD [facility]  11/17/2017  9:45 AM Reva Bores, MD [facility]    Jaynie Collins, MD

## 2017-10-20 NOTE — Patient Instructions (Addendum)
Return to clinic for any scheduled appointments or obstetric concerns, or go to [facility] for evaluation   Deciding about Circumcision in Baby Boys  (The Basics)  What is circumcision?  Circumcision is a surgery that removes the skin that covers the tip of the penis, called the "foreskin" Circumcision is usually done when a boy is between 1 and 10 days old. In the [place], circumcision is common. In some other countries, fewer boys are circumcised. Circumcision is a common tradition in some religions.  Should I have my baby boy circumcised?  There is no easy answer. Circumcision has some benefits. But it also has risks. After talking with your doctor, you will have to decide for yourself what is right for your family.  What are the benefits of circumcision?  Circumcised boys seem to have slightly lower rates of: ?Urinary tract infections ?Swelling of the opening at the tip of the penis Circumcised men seem to have slightly lower rates of: ?Urinary tract infections ?Swelling of the opening at the tip of the penis ?Penis cancer ?HIV and other infections that you catch during sex ?Cervical cancer in the women they have sex with Even so, in the [place], the risks of these problems are small - even in boys and men who have not been circumcised. Plus, boys and men who are not circumcised can reduce these extra risks by: ?Cleaning their penis well ?Using condoms during sex  What are the risks of circumcision?  Risks include: ?Bleeding or infection from the surgery ?Damage to or amputation of the penis ?A chance that the doctor will cut off too much or not enough of the foreskin ?A chance that sex won't feel as good later in life Only about 1 out of every 200 circumcisions leads to problems. There is also a chance that your health insurance won't pay for circumcision.  How is circumcision done in baby boys?  First, the baby gets medicine for pain relief. This might be a cream  on the skin or a shot into the base of the penis. Next, the doctor cleans the baby's penis well. Then he or she uses special tools to cut off the foreskin. Finally, the doctor wraps a bandage (called gauze) around the baby's penis. If you have your baby circumcised, his doctor or nurse will give you instructions on how to care for him after the surgery. It is important that you follow those instructions carefully.   

## 2017-10-22 LAB — STREP GP B NAA: STREP GROUP B AG: NEGATIVE

## 2017-11-03 ENCOUNTER — Ambulatory Visit (INDEPENDENT_AMBULATORY_CARE_PROVIDER_SITE_OTHER): Payer: BLUE CROSS/BLUE SHIELD | Admitting: Family Medicine

## 2017-11-03 VITALS — BP 109/75 | HR 72 | Wt 162.5 lb

## 2017-11-03 DIAGNOSIS — Z3480 Encounter for supervision of other normal pregnancy, unspecified trimester: Secondary | ICD-10-CM

## 2017-11-03 NOTE — Patient Instructions (Signed)

## 2017-11-03 NOTE — Progress Notes (Signed)
   PRENATAL VISIT NOTE  Subjective:  Tabitha Rose is a 32 y.o. G2P1001 at 2473w0d being seen today for ongoing prenatal care.  She is currently monitored for the following issues for this low-risk pregnancy and has Supervision of other normal pregnancy, antepartum on their problem list.  Patient reports no complaints.  Contractions: Not present. Vag. Bleeding: None.  Movement: Present. Denies leaking of fluid.   The following portions of the patient's history were reviewed and updated as appropriate: allergies, current medications, past family history, past medical history, past social history, past surgical history and problem list. Problem list updated.  Objective:   Vitals:   11/03/17 0959  BP: 109/75  Pulse: 72  Weight: 162 lb 8 oz (73.7 kg)    Fetal Status: Fetal Heart Rate (bpm): 138 Fundal Height: 37 cm Movement: Present  Presentation: Vertex  General:  Alert, oriented and cooperative. Patient is in no acute distress.  Skin: Skin is warm and dry. No rash noted.   Cardiovascular: Normal heart rate noted  Respiratory: Normal respiratory effort, no problems with respiration noted  Abdomen: Soft, gravid, appropriate for gestational age.  Pain/Pressure: Present     Pelvic: Cervical exam performed Dilation: 1.5 Effacement (%): 50 Station: Ballotable  Extremities: Normal range of motion.  Edema: None  Mental Status: Normal mood and affect. Normal behavior. Normal judgment and thought content.   Assessment and Plan:  Pregnancy: G2P1001 at 1873w0d  1. Supervision of other normal pregnancy, antepartum Continue routine prenatal care. Wants IOL at 40 wks-->may book at next visit.   Term labor symptoms and general obstetric precautions including but not limited to vaginal bleeding, contractions, leaking of fluid and fetal movement were reviewed in detail with the patient. Please refer to After Visit Summary for other counseling recommendations.  Return in 2 weeks (on  11/17/2017).  Future Appointments  Date Time Provider Department Center  11/10/2017  9:45 AM Anyanwu, Jethro BastosUgonna A, MD [facility]  11/17/2017  9:45 AM Reva BoresPratt, Greig Altergott S, MD [facility]    Reva Boresanya S Jarrah Seher, MD

## 2017-11-03 NOTE — Progress Notes (Signed)
Patient reports having some diarrhea for a couple of weeks.

## 2017-11-10 ENCOUNTER — Encounter: Payer: Self-pay | Admitting: Obstetrics & Gynecology

## 2017-11-10 ENCOUNTER — Ambulatory Visit (INDEPENDENT_AMBULATORY_CARE_PROVIDER_SITE_OTHER): Payer: BLUE CROSS/BLUE SHIELD | Admitting: Obstetrics & Gynecology

## 2017-11-10 VITALS — BP 115/75 | HR 93 | Wt 165.0 lb

## 2017-11-10 DIAGNOSIS — Z3480 Encounter for supervision of other normal pregnancy, unspecified trimester: Secondary | ICD-10-CM

## 2017-11-10 DIAGNOSIS — Z3483 Encounter for supervision of other normal pregnancy, third trimester: Secondary | ICD-10-CM

## 2017-11-10 NOTE — Progress Notes (Signed)
   PRENATAL VISIT NOTE  Subjective:  Tabitha Rose is a 32 y.o. G2P1001 at 5637w0d being seen today for ongoing prenatal care.  She is currently monitored for the following issues for this low-risk pregnancy and has Supervision of other normal pregnancy, antepartum on their problem list.  Patient reports occasional contractions.  Contractions: Irregular. Vag. Bleeding: None.  Movement: Present. Denies leaking of fluid.   The following portions of the patient's history were reviewed and updated as appropriate: allergies, current medications, past family history, past medical history, past social history, past surgical history and problem list. Problem list updated.  Objective:   Vitals:   11/10/17 0958  BP: 115/75  Pulse: 93  Weight: 165 lb (74.8 kg)    Fetal Status: Fetal Heart Rate (bpm): 140 Fundal Height: 38 cm Movement: Present  Presentation: Vertex  General:  Alert, oriented and cooperative. Patient is in no acute distress.  Skin: Skin is warm and dry. No rash noted.   Cardiovascular: Normal heart rate noted  Respiratory: Normal respiratory effort, no problems with respiration noted  Abdomen: Soft, gravid, appropriate for gestational age.  Pain/Pressure: Absent     Pelvic: Cervical exam performed Dilation: 2 Effacement (%): 50 Station: Ballotable  Extremities: Normal range of motion.  Edema: None  Mental Status: Normal mood and affect. Normal behavior. Normal judgment and thought content.   Assessment and Plan:  Pregnancy: G2P1001 at 3137w0d  1. Supervision of other normal pregnancy, antepartum Term labor symptoms and general obstetric precautions including but not limited to vaginal bleeding, contractions, leaking of fluid and fetal movement were reviewed in detail with the patient. Please refer to After Visit Summary for other counseling recommendations.  Return in about 1 week (around 11/17/2017) for OB Visit.  Future Appointments  Date Time Provider Department Center    11/17/2017  9:45 AM Reva BoresPratt, Tanya S, MD [facility]    Jaynie CollinsUgonna Henry Utsey, MD

## 2017-11-10 NOTE — Patient Instructions (Signed)
Return to clinic for any scheduled appointments or obstetric concerns, or go to MAU for evaluation  

## 2017-11-10 NOTE — Progress Notes (Signed)
Desires Induction 6/26

## 2017-11-17 ENCOUNTER — Ambulatory Visit (INDEPENDENT_AMBULATORY_CARE_PROVIDER_SITE_OTHER): Payer: BLUE CROSS/BLUE SHIELD | Admitting: Family Medicine

## 2017-11-17 ENCOUNTER — Encounter: Payer: Self-pay | Admitting: Family Medicine

## 2017-11-17 VITALS — BP 123/78 | HR 102 | Wt 166.0 lb

## 2017-11-17 DIAGNOSIS — Z3483 Encounter for supervision of other normal pregnancy, third trimester: Secondary | ICD-10-CM

## 2017-11-17 DIAGNOSIS — Z3480 Encounter for supervision of other normal pregnancy, unspecified trimester: Secondary | ICD-10-CM

## 2017-11-17 NOTE — Progress Notes (Signed)
Induction 11/25/17 at 0700

## 2017-11-17 NOTE — Patient Instructions (Signed)

## 2017-11-17 NOTE — Progress Notes (Signed)
   PRENATAL VISIT NOTE  Subjective:  Tabitha Rose is a 32 y.o. G2P1001 at 10049w0d being seen today for ongoing prenatal care.  She is currently monitored for the following issues for this low-risk pregnancy and has Supervision of other normal pregnancy, antepartum on their problem list.  Patient reports no complaints.  Contractions: Irregular. Vag. Bleeding: None.  Movement: Present. Denies leaking of fluid.   The following portions of the patient's history were reviewed and updated as appropriate: allergies, current medications, past family history, past medical history, past social history, past surgical history and problem list. Problem list updated.  Objective:   Vitals:   11/17/17 0955  BP: 123/78  Pulse: (!) 102  Weight: 166 lb (75.3 kg)    Fetal Status: Fetal Heart Rate (bpm): 148 Fundal Height: 37 cm Movement: Present  Presentation: Vertex  General:  Alert, oriented and cooperative. Patient is in no acute distress.  Skin: Skin is warm and dry. No rash noted.   Cardiovascular: Normal heart rate noted  Respiratory: Normal respiratory effort, no problems with respiration noted  Abdomen: Soft, gravid, appropriate for gestational age.  Pain/Pressure: Present     Pelvic: Cervical exam performed Dilation: 2 Effacement (%): 50 Station: Ballotable  Extremities: Normal range of motion.  Edema: None  Mental Status: Normal mood and affect. Normal behavior. Normal judgment and thought content.   Assessment and Plan:  Pregnancy: G2P1001 at 749w0d  1. Supervision of other normal pregnancy, antepartum For IOL at 40 wks, outpt foley balloon ripening.  Term labor symptoms and general obstetric precautions including but not limited to vaginal bleeding, contractions, leaking of fluid and fetal movement were reviewed in detail with the patient. Please refer to After Visit Summary for other counseling recommendations.  Return in 1 week (on 11/24/2017).  Future Appointments  Date Time Provider  Department Center  11/24/2017  2:15 PM Reva BoresPratt, Cordelro Gautreau S, MD [facility]  11/25/2017  7:00 AM [facility] None    Reva Boresanya S Winnell Bento, MD

## 2017-11-21 ENCOUNTER — Other Ambulatory Visit: Payer: Self-pay | Admitting: Advanced Practice Midwife

## 2017-11-23 ENCOUNTER — Encounter (HOSPITAL_COMMUNITY): Payer: Self-pay | Admitting: *Deleted

## 2017-11-23 ENCOUNTER — Telehealth (HOSPITAL_COMMUNITY): Payer: Self-pay | Admitting: *Deleted

## 2017-11-23 NOTE — Telephone Encounter (Signed)
Preadmission screen  

## 2017-11-24 ENCOUNTER — Ambulatory Visit (INDEPENDENT_AMBULATORY_CARE_PROVIDER_SITE_OTHER): Payer: BLUE CROSS/BLUE SHIELD | Admitting: Family Medicine

## 2017-11-24 ENCOUNTER — Inpatient Hospital Stay (HOSPITAL_COMMUNITY): Admit: 2017-11-24 | Payer: BLUE CROSS/BLUE SHIELD

## 2017-11-24 VITALS — BP 117/77 | HR 101 | Wt 169.4 lb

## 2017-11-24 DIAGNOSIS — Z3480 Encounter for supervision of other normal pregnancy, unspecified trimester: Secondary | ICD-10-CM

## 2017-11-24 DIAGNOSIS — Z3483 Encounter for supervision of other normal pregnancy, third trimester: Secondary | ICD-10-CM | POA: Diagnosis not present

## 2017-11-24 NOTE — Progress Notes (Signed)
NST DX Foley Bulb

## 2017-11-24 NOTE — Progress Notes (Signed)
   PRENATAL VISIT NOTE  Subjective:  Tabitha Rose is a 32 y.o. G2P1001 at 10641w0d being seen today for ongoing prenatal care.  She is currently monitored for the following issues for this low-risk pregnancy and has Supervision of other normal pregnancy, antepartum on their problem list.  Patient reports no complaints.  Contractions: Irregular. Vag. Bleeding: None.  Movement: Present. Denies leaking of fluid.   The following portions of the patient's history were reviewed and updated as appropriate: allergies, current medications, past family history, past medical history, past social history, past surgical history and problem list. Problem list updated.  Objective:   Vitals:   11/24/17 1422  BP: 117/77  Pulse: (!) 101  Weight: 169 lb 6.4 oz (76.8 kg)    Fetal Status:     Movement: Present     General:  Alert, oriented and cooperative. Patient is in no acute distress.  Skin: Skin is warm and dry. No rash noted.   Cardiovascular: Normal heart rate noted  Respiratory: Normal respiratory effort, no problems with respiration noted  Abdomen: Soft, gravid, appropriate for gestational age.  Pain/Pressure: Present     Pelvic: Cervical exam deferred        Extremities: Normal range of motion.  Edema: None  Mental Status:  Normal mood and affect. Normal behavior. Normal judgment and thought content.  Procedure: Patient informed of R/B/A of procedure. NST was performed and was reactive prior to procedure. NST:  EFM: Baseline: 130 bpm, Variability: Good {> 6 bpm), Accelerations: Reactive and Decelerations: Absent Toco: irregular, every 3-5 minutes Procedure done to begin ripening of the cervix prior to admission for induction of labor. Appropriate time out taken. The patient was placed in the lithotomy position and the cervix brought into view with sterile speculum. A ring forcep was used to guide the 59F foley balloon through the internal os of the cervix. Foley Balloon filled with 60cc of  sterile water. Plug inserted into end of the foley. Foley placed on tension and taped to medical thigh.  NST:  EFM Baseline: 130 bpm, Variability: Good {> 6 bpm), Accelerations: Reactive and Decelerations: Absent  Toco: irregular, every 3-5 minutes There were no signs of tachysystole or hypertonus. All equipment was removed and accounted for. The patient tolerated the procedure well.  Assessment and Plan:  Pregnancy: G2P1001 at 941w0d 1. Supervision of other normal pregnancy, antepartum S/p Foley for cervical ripening. - Fetal nonstress test   S/p Outpatient placement of foley balloon catheter for cervical ripening. Induction of labor scheduled for tomorrow at 0700 am. Reassuring FHR tracing with no concerns at present. Warning signs given to patient to include return to MAU for heavy vaginal bleeding, Rupture of membranes, painful uterine contractions q 5 mins or less, severe abdominal discomfort, decreased fetal movement.  Return in 1 month (on 12/22/2017).   Reva Boresanya S Velma Hanna, MD 11/24/2017 4:29 PM

## 2017-11-24 NOTE — Patient Instructions (Addendum)

## 2017-11-25 ENCOUNTER — Inpatient Hospital Stay (HOSPITAL_COMMUNITY)
Admission: RE | Admit: 2017-11-25 | Discharge: 2017-11-27 | DRG: 807 | Disposition: A | Discharge status: Home / Self Care | Payer: BLUE CROSS/BLUE SHIELD | Attending: Obstetrics & Gynecology | Admitting: Obstetrics & Gynecology

## 2017-11-25 ENCOUNTER — Inpatient Hospital Stay (HOSPITAL_COMMUNITY): Payer: BLUE CROSS/BLUE SHIELD | Admitting: Anesthesiology

## 2017-11-25 ENCOUNTER — Encounter (HOSPITAL_COMMUNITY): Payer: Self-pay

## 2017-11-25 VITALS — BP 105/72 | HR 65 | Temp 97.7°F | Resp 16 | Ht 62.0 in | Wt 168.2 lb

## 2017-11-25 DIAGNOSIS — Z23 Encounter for immunization: Secondary | ICD-10-CM | POA: Diagnosis not present

## 2017-11-25 DIAGNOSIS — O480 Post-term pregnancy: Secondary | ICD-10-CM | POA: Diagnosis not present

## 2017-11-25 DIAGNOSIS — Z3A40 40 weeks gestation of pregnancy: Secondary | ICD-10-CM

## 2017-11-25 DIAGNOSIS — Z3480 Encounter for supervision of other normal pregnancy, unspecified trimester: Secondary | ICD-10-CM

## 2017-11-25 LAB — TYPE AND SCREEN
ABO/RH(D): O POS
Antibody Screen: NEGATIVE

## 2017-11-25 LAB — CBC
HCT: 36.3 % (ref 36.0–46.0)
Hemoglobin: 12.6 g/dL (ref 12.0–15.0)
MCH: 31 pg (ref 26.0–34.0)
MCHC: 34.7 g/dL (ref 30.0–36.0)
MCV: 89.4 fL (ref 78.0–100.0)
PLATELETS: 252 10*3/uL (ref 150–400)
RBC: 4.06 MIL/uL (ref 3.87–5.11)
RDW: 13.2 % (ref 11.5–15.5)
WBC: 10.7 10*3/uL — ABNORMAL HIGH (ref 4.0–10.5)

## 2017-11-25 LAB — RPR: RPR Ser Ql: NONREACTIVE

## 2017-11-25 MED ORDER — FENTANYL 2.5 MCG/ML BUPIVACAINE 1/10 % EPIDURAL INFUSION (WH - ANES)
14.0000 mL/h | INTRAMUSCULAR | Status: DC | PRN
  Administered 2017-11-25 (×2): 14 mL/h via EPIDURAL
  Filled 2017-11-25: qty 100

## 2017-11-25 MED ORDER — PHENYLEPHRINE 40 MCG/ML (10ML) SYRINGE FOR IV PUSH (FOR BLOOD PRESSURE SUPPORT)
80.0000 ug | PREFILLED_SYRINGE | INTRAVENOUS | Status: DC | PRN
  Filled 2017-11-25: qty 5

## 2017-11-25 MED ORDER — IBUPROFEN 600 MG PO TABS
600.0000 mg | ORAL_TABLET | Freq: Four times a day (QID) | ORAL | Status: DC
  Administered 2017-11-25 – 2017-11-27 (×7): 600 mg via ORAL
  Filled 2017-11-25 (×7): qty 1

## 2017-11-25 MED ORDER — FENTANYL CITRATE (PF) 100 MCG/2ML IJ SOLN: 100.0000 ug | INTRAMUSCULAR | Status: DC | PRN

## 2017-11-25 MED ORDER — LACTATED RINGERS IV SOLN: 500.0000 mL | INTRAVENOUS | Status: DC | PRN

## 2017-11-25 MED ORDER — OXYTOCIN 40 UNITS IN LACTATED RINGERS INFUSION - SIMPLE MED
1.0000 m[IU]/min | INTRAVENOUS | Status: DC
  Administered 2017-11-25: 2 m[IU]/min via INTRAVENOUS

## 2017-11-25 MED ORDER — ONDANSETRON HCL 4 MG/2ML IJ SOLN: 4.0000 mg | Freq: Four times a day (QID) | INTRAMUSCULAR | Status: DC | PRN

## 2017-11-25 MED ORDER — LACTATED RINGERS IV SOLN
INTRAVENOUS | Status: DC
  Administered 2017-11-25 (×3): via INTRAVENOUS

## 2017-11-25 MED ORDER — BENZOCAINE-MENTHOL 20-0.5 % EX AERO
1.0000 "application " | INHALATION_SPRAY | CUTANEOUS | Status: DC | PRN
  Administered 2017-11-26: 1 via TOPICAL
  Filled 2017-11-25: qty 56

## 2017-11-25 MED ORDER — OXYCODONE-ACETAMINOPHEN 5-325 MG PO TABS: 1.0000 | ORAL_TABLET | ORAL | Status: DC | PRN

## 2017-11-25 MED ORDER — SIMETHICONE 80 MG PO CHEW: 80.0000 mg | CHEWABLE_TABLET | ORAL | Status: DC | PRN

## 2017-11-25 MED ORDER — DIPHENHYDRAMINE HCL 25 MG PO CAPS: 25.0000 mg | ORAL_CAPSULE | Freq: Four times a day (QID) | ORAL | Status: DC | PRN

## 2017-11-25 MED ORDER — OXYCODONE-ACETAMINOPHEN 5-325 MG PO TABS: 2.0000 | ORAL_TABLET | ORAL | Status: DC | PRN

## 2017-11-25 MED ORDER — LACTATED RINGERS IV SOLN
500.0000 mL | Freq: Once | INTRAVENOUS | Status: AC
  Administered 2017-11-25: 500 mL via INTRAVENOUS

## 2017-11-25 MED ORDER — ACETAMINOPHEN 325 MG PO TABS: 650.0000 mg | ORAL_TABLET | ORAL | Status: DC | PRN

## 2017-11-25 MED ORDER — LIDOCAINE HCL (PF) 1 % IJ SOLN
30.0000 mL | INTRAMUSCULAR | Status: DC | PRN
  Filled 2017-11-25: qty 30

## 2017-11-25 MED ORDER — ONDANSETRON HCL 4 MG PO TABS: 4.0000 mg | ORAL_TABLET | ORAL | Status: DC | PRN

## 2017-11-25 MED ORDER — TERBUTALINE SULFATE 1 MG/ML IJ SOLN
0.2500 mg | Freq: Once | INTRAMUSCULAR | Status: DC | PRN
  Filled 2017-11-25: qty 1

## 2017-11-25 MED ORDER — ONDANSETRON HCL 4 MG/2ML IJ SOLN: 4.0000 mg | INTRAMUSCULAR | Status: DC | PRN

## 2017-11-25 MED ORDER — COCONUT OIL OIL
1.0000 "application " | TOPICAL_OIL | Status: DC | PRN
  Administered 2017-11-27: 1 via TOPICAL
  Filled 2017-11-25: qty 120

## 2017-11-25 MED ORDER — EPHEDRINE 5 MG/ML INJ
10.0000 mg | INTRAVENOUS | Status: DC | PRN
  Filled 2017-11-25: qty 2

## 2017-11-25 MED ORDER — WITCH HAZEL-GLYCERIN EX PADS: 1.0000 "application " | MEDICATED_PAD | CUTANEOUS | Status: DC | PRN

## 2017-11-25 MED ORDER — OXYTOCIN BOLUS FROM INFUSION
500.0000 mL | Freq: Once | INTRAVENOUS | Status: AC
  Administered 2017-11-25: 500 mL via INTRAVENOUS

## 2017-11-25 MED ORDER — ZOLPIDEM TARTRATE 5 MG PO TABS: 5.0000 mg | ORAL_TABLET | Freq: Every evening | ORAL | Status: DC | PRN

## 2017-11-25 MED ORDER — SENNOSIDES-DOCUSATE SODIUM 8.6-50 MG PO TABS
2.0000 | ORAL_TABLET | ORAL | Status: DC
  Administered 2017-11-25 – 2017-11-26 (×2): 2 via ORAL
  Filled 2017-11-25 (×2): qty 2

## 2017-11-25 MED ORDER — DIPHENHYDRAMINE HCL 50 MG/ML IJ SOLN: 12.5000 mg | INTRAMUSCULAR | Status: DC | PRN

## 2017-11-25 MED ORDER — PRENATAL MULTIVITAMIN CH
1.0000 | ORAL_TABLET | Freq: Every day | ORAL | Status: DC
  Administered 2017-11-26 – 2017-11-27 (×2): 1 via ORAL
  Filled 2017-11-25 (×2): qty 1

## 2017-11-25 MED ORDER — OXYTOCIN 40 UNITS IN LACTATED RINGERS INFUSION - SIMPLE MED
2.5000 [IU]/h | INTRAVENOUS | Status: DC
  Administered 2017-11-25: 2.5 [IU]/h via INTRAVENOUS
  Filled 2017-11-25: qty 1000

## 2017-11-25 MED ORDER — PHENYLEPHRINE 40 MCG/ML (10ML) SYRINGE FOR IV PUSH (FOR BLOOD PRESSURE SUPPORT)
80.0000 ug | PREFILLED_SYRINGE | INTRAVENOUS | Status: DC | PRN
  Filled 2017-11-25: qty 10
  Filled 2017-11-25: qty 5

## 2017-11-25 MED ORDER — FENTANYL 2.5 MCG/ML BUPIVACAINE 1/10 % EPIDURAL INFUSION (WH - ANES): 14.0000 mL/h | INTRAMUSCULAR | Status: DC | PRN

## 2017-11-25 MED ORDER — LIDOCAINE HCL (PF) 1 % IJ SOLN
INTRAMUSCULAR | Status: DC | PRN
  Administered 2017-11-25 (×2): 4 mL via EPIDURAL

## 2017-11-25 MED ORDER — EPHEDRINE 5 MG/ML INJ
10.0000 mg | INTRAVENOUS | Status: DC | PRN
Start: 2017-11-25 — End: 2017-11-25
  Filled 2017-11-25: qty 2

## 2017-11-25 MED ORDER — TETANUS-DIPHTH-ACELL PERTUSSIS 5-2.5-18.5 LF-MCG/0.5 IM SUSP: 0.5000 mL | Freq: Once | INTRAMUSCULAR | Status: DC

## 2017-11-25 MED ORDER — DIBUCAINE 1 % RE OINT: 1.0000 "application " | TOPICAL_OINTMENT | RECTAL | Status: DC | PRN

## 2017-11-25 MED ORDER — SOD CITRATE-CITRIC ACID 500-334 MG/5ML PO SOLN: 30.0000 mL | ORAL | Status: DC | PRN

## 2017-11-25 NOTE — Anesthesia Procedure Notes (Signed)
Epidural Patient location during procedure: OB Start time: 11/25/2017 6:08 PM End time: 11/25/2017 6:18 PM  Staffing Anesthesiologist: Lewie LoronGermeroth, Seng Fouts, MD Performed: anesthesiologist   Preanesthetic Checklist Completed: patient identified, pre-op evaluation, timeout performed, IV checked, risks and benefits discussed and monitors and equipment checked  Epidural Patient position: sitting Prep: site prepped and draped and DuraPrep Patient monitoring: heart rate, continuous pulse ox and blood pressure Approach: midline Location: L3-L4 Injection technique: LOR air and LOR saline  Needle:  Needle type: Tuohy  Needle gauge: 17 G Needle length: 9 cm Needle insertion depth: 5 cm Catheter type: closed end flexible Catheter size: 19 Gauge Catheter at skin depth: 10 cm Test dose: negative  Assessment Sensory level: T8 Events: blood not aspirated, injection not painful, no injection resistance, negative IV test and no paresthesia  Additional Notes Reason for block:procedure for pain

## 2017-11-25 NOTE — Anesthesia Preprocedure Evaluation (Signed)
Anesthesia Evaluation  Patient identified by MRN, date of birth, ID band Patient awake    Reviewed: Allergy & Precautions, NPO status , Patient's Chart, lab work & pertinent test results  History of Anesthesia Complications Negative for: history of anesthetic complications  Airway Mallampati: II  TM Distance: >3 FB Neck ROM: Full    Dental no notable dental hx. (+) Dental Advisory Given   Pulmonary neg pulmonary ROS,    Pulmonary exam normal breath sounds clear to auscultation       Cardiovascular negative cardio ROS Normal cardiovascular exam Rhythm:Regular Rate:Normal     Neuro/Psych negative neurological ROS  negative psych ROS   GI/Hepatic negative GI ROS, Neg liver ROS,   Endo/Other  obesity  Renal/GU negative Renal ROS     Musculoskeletal negative musculoskeletal ROS (+)   Abdominal   Peds  Hematology negative hematology ROS (+)   Anesthesia Other Findings   Reproductive/Obstetrics (+) Pregnancy                             Anesthesia Physical  Anesthesia Plan  ASA: II  Anesthesia Plan: Epidural   Post-op Pain Management:    Induction:   PONV Risk Score and Plan:   Airway Management Planned:   Additional Equipment:   Intra-op Plan:   Post-operative Plan:   Informed Consent: I have reviewed the patients History and Physical, chart, labs and discussed the procedure including the risks, benefits and alternatives for the proposed anesthesia with the patient or authorized representative who has indicated his/her understanding and acceptance.   Dental advisory given  Plan Discussed with: CRNA  Anesthesia Plan Comments:         Anesthesia Quick Evaluation

## 2017-11-25 NOTE — Progress Notes (Signed)
Patient comfortable with epidural. AROM'd with clear fluid at 1830. Cat 1 tracing. Continue to titrate up Pitocin as appropriate. Expectant management.   Caryl AdaJazma Aleighya Mcanelly, DO OB Fellow Center for [facility], [facility]

## 2017-11-25 NOTE — Anesthesia Pain Management Evaluation Note (Signed)
  CRNA Pain Management Visit Note  Patient: Tabitha ReiningMarisa Ferryman, 32 y.o., female  "Hello I am a [facility] the anesthesia [facility]. We have an anesthesia team available at all times to provide care throughout the hospital, including epidural management and anesthesia for C-section. I don't know your plan for the delivery whether it a natural birth, water birth, IV sedation, nitrous supplementation, doula or epidural, but we want to meet your pain goals."   1.Was your pain managed to your expectations on prior hospitalizations?   Yes   2.What is your expectation for pain management during this hospitalization?     Epidural  3.How can we help you reach that goal? support  Record the patient's initial score and the patient's pain goal.   Pain: 2  Pain Goal: 6 [facility] wants you to be able to say your pain was always managed very well.  Trellis PaganiniBREWER,Hale Chalfin N 11/25/2017

## 2017-11-25 NOTE — Progress Notes (Signed)
LABOR PROGRESS NOTE  Elisha Hewes is a 32 y.o. G2P1001 at 576w1d  admitted fWaldon Reiningor elective IOL for postdates  Subjective: Uncomfortable with contractions. Feeling pressure.  Objective: BP 117/74   Pulse 75   Temp 97.9 F (36.6 C) (Oral)   Resp 20   Ht 5\' 2"  (1.575 m)   Wt 76.3 kg (168 lb 4 oz)   LMP 02/17/2017   SpO2 99%   BMI 30.77 kg/m  or  Vitals:   11/25/17 1851 11/25/17 1901 11/25/17 1931 11/25/17 2001  BP: 126/81 122/72 124/76 117/74  Pulse: 78 75 74 75  Resp: 18 20 18 20   Temp:   97.9 F (36.6 C)   TempSrc:   Oral   SpO2:      Weight:      Height:         Dilation: 10 Dilation Complete Date: 11/25/17 Dilation Complete Time: 2029 Effacement (%): 100 Station: 0 Presentation: Vertex Exam by:: Dr. Doroteo GlassmanPhelps FHT: 115, mod var, reactiv accel, early decel Uterine activity: 1-3 min  Pitocin @ 5210mu/min AROM clear 1832  Labs: Lab Results  Component Value Date   WBC 10.7 (H) 11/25/2017   HGB 12.6 11/25/2017   HCT 36.3 11/25/2017   MCV 89.4 11/25/2017   PLT 252 11/25/2017    Patient Active Problem List   Diagnosis Date Noted  . Post term pregnancy over 40 weeks 11/25/2017  . Supervision of other normal pregnancy, antepartum 04/21/2017    Assessment / Plan: 32 y.o. G2P1001 at 116w1d here for IOL for postdates  Labor: Active labor, cont augmentation with Pit. Expectant management.  Fetal Wellbeing:  Cat I Pain Control:  Epidural  Anticipated MOD:  NSVD  Caryl AdaJazma Phelps, DO  6/26/20198:35 PM

## 2017-11-25 NOTE — H&P (Signed)
Obstetric History and Physical  Tabitha Rose is a 32 y.o. G2P1001 with IUP at [redacted]w[redacted]d presenting for elective IOL for postdates. Patient had outpatient FB placed in clinic yesterday afternoon. FB fell out around 10p yesterday but wasn't feeling any labor pains so waited to come in. Patient states she has been having  Irregular contractions, minimal vaginal bleeding, intact membranes, with active fetal movement.  No blurry vision, headaches or peripheral edema, and RUQ pain.   Prenatal Course Source of Care: [facility]  Dating: By LMP --->  Estimated Date of Delivery: 11/24/17 Pregnancy complications or risks: Patient Active Problem List   Diagnosis Date Noted  . Post term pregnancy over 40 weeks 11/25/2017  . Supervision of other normal pregnancy, antepartum 04/21/2017   She plans to breastfeed She desires vasectomy, barrier methods for postpartum contraception.   Sono:    @[redacted]w[redacted]d , CWD, normal anatomy, cephalic presentation, posterior placenta, 617g, 57% EFW  Prenatal labs and studies: ABO, Rh: O/Positive/-- (11/21 1348) Antibody: Negative (11/21 1348) Rubella: 3.24 (11/21 1348) RPR: Non Reactive (03/29 0845)  HBsAg: Negative (11/21 1348)  HIV: Non Reactive (03/29 0845)  ZOX:WRUEAVWU (05/21 1030)  2 hr Glucola  normal Genetic screening normal Anatomy US normal  Prenatal Transfer Tool  Maternal Diabetes: No Genetic Screening: Normal Maternal Ultrasounds/Referrals: Normal Fetal Ultrasounds or other Referrals:  None Maternal Substance Abuse:  No Significant Maternal Medications:  None Significant Maternal Lab Results: None  Past Medical History:  Diagnosis Date  . Medical history non-contributory     Past Surgical History:  Procedure Laterality Date  . NO PAST SURGERIES    . WISDOM TOOTH EXTRACTION      OB History  Gravida Para Term Preterm AB Living  2 1 1  0 0 1  SAB TAB Ectopic Multiple Live Births  0 0 0 0 1    # Outcome Date GA Lbr Len/2nd Weight Sex Delivery Anes  PTL Lv  2 Current           1 Term 03/27/15 [redacted]w[redacted]d 21:29 / 08:20 3.805 kg (8 lb 6.2 oz) F Vag-Spont EPI  LIV    Social History   Socioeconomic History  . Marital status: Married    Spouse name: Not on file  . Number of children: Not on file  . Years of education: Not on file  . Highest education level: Not on file  Occupational History  . Not on file  Social Needs  . Financial resource strain: Not on file  . Food insecurity:    Worry: Not on file    Inability: Not on file  . Transportation needs:    Medical: Not on file    Non-medical: Not on file  Tobacco Use  . Smoking status: Never Smoker  . Smokeless tobacco: Never Used  Substance and Sexual Activity  . Alcohol use: No    Alcohol/week: 0.0 oz  . Drug use: No  . Sexual activity: Not Currently    Partners: Male    Birth control/protection: None  Lifestyle  . Physical activity:    Days per week: Not on file    Minutes per session: Not on file  . Stress: Not on file  Relationships  . Social connections:    Talks on phone: Not on file    Gets together: Not on file    Attends religious service: Not on file    Active member of club or organization: Not on file    Attends meetings of clubs or organizations: Not on file  Relationship status: Not on file  Other Topics Concern  . Not on file  Social History Narrative  . Not on file    Family History  Problem Relation Age of Onset  . Breast cancer Mother 2543    Medications Prior to Admission  Medication Sig Dispense Refill Last Dose  . calcium carbonate (TUMS - DOSED IN MG ELEMENTAL CALCIUM) 500 MG chewable tablet Chew 1 tablet by mouth 2 (two) times daily as needed for indigestion or heartburn.   Past Week at Unknown time  . Doxylamine-Pyridoxine (BONJESTA PO) Take 1 tablet by mouth daily.    11/24/2017 at Unknown time  . Prenatal Vit-Fe Fumarate-FA (MULTIVITAMIN-PRENATAL) 27-0.8 MG TABS tablet Take 1 tablet by mouth daily at 12 noon.   11/24/2017 at Unknown time     No Known Allergies  Review of Systems: Negative except for what is mentioned in HPI.  Physical Exam: BP 114/80   Pulse 90   Temp 98.8 F (37.1 C) (Oral)   Resp 20   Ht 5\' 2"  (1.575 m)   Wt 76.3 kg (168 lb 4 oz)   LMP 02/17/2017   BMI 30.77 kg/m  CONSTITUTIONAL: Well-developed, well-nourished female in no acute distress.  HENT:  Normocephalic, atraumatic, External right and left ear normal. Oropharynx is clear and moist EYES: Conjunctivae and EOM are normal. Pupils are equal, round, and reactive to light. No scleral icterus.  NECK: Normal range of motion, supple, no masses SKIN: Skin is warm and dry. No rash noted. Not diaphoretic. No erythema. No pallor. NEUROLOGIC: Alert and oriented to person, place, and time. Normal reflexes, muscle tone coordination. No cranial nerve deficit noted. PSYCHIATRIC: Normal mood and affect. Normal behavior. Normal judgment and thought content. CARDIOVASCULAR: Normal heart rate noted, regular rhythm RESPIRATORY: Effort and breath sounds normal, no problems with respiration noted ABDOMEN: Soft, nontender, nondistended, gravid. MUSCULOSKELETAL: Normal range of motion. No edema and no tenderness. 2+ distal pulses.  Cervical Exam: Dilation: 4 Effacement (%): 60 Station: -3 Presentation: Vertex Exam by:: Raliegh Ipatherine Stout RN  Presentation: cephalic FHT:  Baseline rate 130 bpm   Variability moderate  Accelerations present   Decelerations none Contractions: Every 2-5 mins   Pertinent Labs/Studies:   Results for orders placed or performed during the hospital encounter of 11/25/17 (from the past 24 hour(s))  CBC     Status: Abnormal   Collection Time: 11/25/17  8:05 AM  Result Value Ref Range   WBC 10.7 (H) 4.0 - 10.5 K/uL   RBC 4.06 3.87 - 5.11 MIL/uL   Hemoglobin 12.6 12.0 - 15.0 g/dL   HCT 16.136.3 09.636.0 - 04.546.0 %   MCV 89.4 78.0 - 100.0 fL   MCH 31.0 26.0 - 34.0 pg   MCHC 34.7 30.0 - 36.0 g/dL   RDW 40.913.2 81.111.5 - 91.415.5 %   Platelets 252 150 - 400  K/uL    Assessment : Tabitha Rose is a 32 y.o. G2P1001 at 8689w1d being admitted for induction of labor due to postdates.  Plan: Labor: S/p outpatient FB. Early labor. Will continue induction with Pitocin.  Analgesia as needed. Planning for epidural. FWB: Reassuring fetal heart tracing.   GBS negative Delivery plan: Hopeful for vaginal delivery   Caryl AdaJazma Vannie Hochstetler, DO OB Fellow Faculty Practice, [facility] 11/25/2017, 8:54 AM

## 2017-11-25 NOTE — Progress Notes (Signed)
LABOR PROGRESS NOTE  Tabitha Rose is a 32 y.o. G2P1001 at 1414w1d  admitted for elective IOL for postdates  Subjective: Pt is comfortable. Not feeling ctx very much.   Objective: BP 112/64   Pulse 72   Temp 98.3 F (36.8 C) (Oral)   Resp 18   Ht 5\' 2"  (1.575 m)   Wt 168 lb 4 oz (76.3 kg)   LMP 02/17/2017   BMI 30.77 kg/m  or  Vitals:   11/25/17 1409 11/25/17 1431 11/25/17 1501 11/25/17 1531  BP: 118/73 114/67 114/69 112/64  Pulse: 79 81 71 72  Resp:  18 16 18   Temp:      TempSrc:      Weight:      Height:         Dilation: 5 Effacement (%): 70 Station: -2 Presentation: Vertex Exam by:: Dr. Maisie Fushomas FHT: 100, mod var, reactiv accel, no decel Uterine activity: 1-3 min  Labs: Lab Results  Component Value Date   WBC 10.7 (H) 11/25/2017   HGB 12.6 11/25/2017   HCT 36.3 11/25/2017   MCV 89.4 11/25/2017   PLT 252 11/25/2017    Patient Active Problem List   Diagnosis Date Noted  . Post term pregnancy over 40 weeks 11/25/2017  . Supervision of other normal pregnancy, antepartum 04/21/2017    Assessment / Plan: 32 y.o. G2P1001 at 5314w1d here for IOL for postdates  Labor: not in active labor, cont augmentation with Pit, plan to AROM when favorable Fetal Wellbeing:  Cat I Pain Control:  Supportive, planning epidural before AROM  Anticipated MOD:  NSVD  Garnette GunnerAaron B Jisell Majer, MD PGY-1 6/26/20193:42 PM

## 2017-11-25 NOTE — Plan of Care (Signed)
Progressing appropriately. Safety information and room orientation complete. Encouraged to call for assistance as needed, and for LATCH assessment. 

## 2017-11-25 NOTE — Consult Note (Signed)
Neonatology Note:  Attendance at Code Apgar:   Our team responded to a Code Apgar call to room # 166 following NSVD, due to infant with apnea. The requesting physician was Dr. Phelps. Prior to our arrival, baby out and crying with good tone and HR.  No intervention beyond stimulation was required by L&D; we were sent away.   Please do not hesitate in contacting us if further concerns.   David C. Ehrmann, MD 

## 2017-11-25 NOTE — Progress Notes (Signed)
LABOR PROGRESS NOTE  Waldon ReiningMarisa Wyszynski is a 32 y.o. G2P1001 at 6413w1d  admitted for elective IOL for postdates  Subjective: Pt is comfortable. Not feeling ctx much.   Objective: BP 118/87   Pulse 80   Temp 98.3 F (36.8 C) (Oral)   Resp 18   Ht 5\' 2"  (1.575 m)   Wt 168 lb 4 oz (76.3 kg)   LMP 02/17/2017   BMI 30.77 kg/m  or  Vitals:   11/25/17 1232 11/25/17 1301 11/25/17 1330 11/25/17 1331  BP: 112/64 102/79  118/87  Pulse: 67 72  80  Resp:   18   Temp:      TempSrc:      Weight:      Height:         Dilation: 4 Effacement (%): 60 Station: -3 Presentation: Vertex Exam by:: Dr. Janee Mornhompson FHT: 130, mod var, no accel/decel Uterine activity: 2-3 min  Labs: Lab Results  Component Value Date   WBC 10.7 (H) 11/25/2017   HGB 12.6 11/25/2017   HCT 36.3 11/25/2017   MCV 89.4 11/25/2017   PLT 252 11/25/2017    Patient Active Problem List   Diagnosis Date Noted  . Post term pregnancy over 40 weeks 11/25/2017  . Supervision of other normal pregnancy, antepartum 04/21/2017    Assessment / Plan: 32 y.o. G2P1001 at 5613w1d here for IOL for postdates  Labor: not in active labor, cont augmentation with Pit Fetal Wellbeing:  Cat I Pain Control:  supportive Anticipated MOD:  NSVD  Garnette GunnerAaron B Thompson, MD PGY-1 6/26/20191:33 PM

## 2017-11-26 ENCOUNTER — Other Ambulatory Visit: Payer: Self-pay

## 2017-11-26 NOTE — Anesthesia Postprocedure Evaluation (Signed)
Anesthesia Post Note  Patient: Tabitha ReiningMarisa Rose  Procedure(s) Performed: AN AD HOC LABOR EPIDURAL     Patient location during evaluation: Mother Baby Anesthesia Type: Epidural Level of consciousness: awake and alert Pain management: pain level controlled Vital Signs Assessment: post-procedure vital signs reviewed and stable Respiratory status: spontaneous breathing, nonlabored ventilation and respiratory function stable Cardiovascular status: stable Postop Assessment: no headache, no backache and epidural receding Anesthetic complications: no    Last Vitals:  Vitals:   11/26/17 0342 11/26/17 0735  BP: 109/76 112/62  Pulse: 67 65  Resp: 18 20  Temp: 37 C 36.7 C  SpO2:      Last Pain:  Vitals:   11/26/17 0735  TempSrc: Oral  PainSc:    Pain Goal:                 Junious SilkGILBERT,Tawona Filsinger

## 2017-11-26 NOTE — Lactation Note (Signed)
This note was copied from a baby's chart. Lactation Consultation Note  Patient Name: Tabitha Waldon ReiningMarisa Emley WUJWJ'XToday's Date: 11/26/2017 Reason for consult: Initial assessment;Infant weight loss 1%, Full term infant, Mom experienced  with BF per mom she  exclusively  BF her daughter for  3 months then supplemented BF/F due low milk volume and used a SNS successfully BF daughter for one year.  .  Mom plans to exclusively BF infant. [facility] baby was on mom's chest STS, not cueing to feed.  Per mom  she had previously BF infant for  30 minutes before LC entered room. Last latch score recorded was 10. Mom  knowledgeable about  hand expressions and compressions when feeding at breast mom able to teach back and demonstrate to [facility].  Mom feed infant according to cueing, 8 to 12 times within 24 hours including nights. [facility] and BF support groups previously with last child and will call LC prn.    Maternal Data Has patient been taught Hand Expression?: Yes Does the patient have breastfeeding experience prior to this delivery?: Yes  Feeding Feeding Type: Breast Fed Length of feed: 30 min  LATCH Score                   Interventions Interventions: Breast massage;Hand express  Lactation Tools Discussed/Used     Consult Status Consult Status: Follow-up Date: 11/27/17 Follow-up type: In-patient    Danelle EarthlyRobin Lawren Sexson 11/26/2017, 3:02 PM

## 2017-11-26 NOTE — Progress Notes (Signed)
Post Partum Day 1 Subjective: no complaints, up ad lib, voiding, tolerating PO and + flatus  Objective: Blood pressure 112/62, pulse 65, temperature 98 F (36.7 C), temperature source Oral, resp. rate 20, height 5\' 2"  (1.575 m), weight 168 lb 4 oz (76.3 kg), last menstrual period 02/17/2017, SpO2 100 %, unknown if currently breastfeeding.  Physical Exam:  General: alert, cooperative and no distress Lochia: appropriate Uterine Fundus: firm Incision: n/a DVT Evaluation: No evidence of DVT seen on physical exam.  Recent Labs    11/25/17 0805  HGB 12.6  HCT 36.3    Assessment/Plan: Plan for discharge tomorrow and Circumcision prior to discharge   LOS: 1 day   Garnette Gunneraron B Thompson 11/26/2017, 10:54 AM

## 2017-11-27 NOTE — Discharge Summary (Signed)
OB Discharge Summary     Patient Name: Tabitha Rose DOB: November 01, 1985 MRN: [id number]  Date of admission: 11/25/2017 Delivering MD: Pincus LargePHELPS, JAZMA Y   Date of discharge: 11/27/2017  Admitting diagnosis: INDUCTION Intrauterine pregnancy: 6080w1d     Secondary diagnosis:  Active Problems:   Post term pregnancy over 40 weeks   SVD (spontaneous vaginal delivery)  Additional problems: none     Discharge diagnosis: Term Pregnancy Delivered                                                                                                Post partum procedures:none  Augmentation: AROM, Pitocin and Foley Balloon  Complications: None  Hospital course:  Induction of Labor With Vaginal Delivery   32 y.o. yo G2P2002 at 4380w1d was admitted to the hospital 11/25/2017 for induction of labor.  Indication for induction: Postdates.  Patient had an uncomplicated labor course as follows: Membrane Rupture Time/Date: 6:32 PM ,11/25/2017   Intrapartum Procedures: Episiotomy: None [1]                                         Lacerations:  None [1]  Patient had delivery of a Viable infant.  Information for the patient's newborn:  Tabitha Rose, Tabitha Rose [[id number]][[id number]]  Delivery Method: Vaginal, Spontaneous(Filed from Delivery Summary)   11/25/2017  Details of delivery can be found in separate delivery note.  Patient had a routine postpartum course. Patient is discharged home 11/27/17.  Physical exam  Vitals:   11/26/17 0735 11/26/17 1113 11/26/17 1430 11/27/17 0535  BP: 112/62 115/73 113/68 105/72  Pulse: 65 83 79 65  Resp: 20 18 20 16   Temp: 98 F (36.7 C) 98.3 F (36.8 C) 98 F (36.7 C) 97.7 F (36.5 C)  TempSrc: Oral Oral Oral Oral  SpO2:    99%  Weight:      Height:       General: alert, cooperative and no distress Lochia: appropriate Uterine Fundus: firm Incision: N/A DVT Evaluation: No evidence of DVT seen on physical exam. Labs: Lab Results  Component Value Date   WBC 10.7 (H) 11/25/2017   HGB 12.6 11/25/2017   HCT 36.3 11/25/2017   MCV 89.4 11/25/2017   PLT 252 11/25/2017   CMP Latest Ref Rng & Units 01/01/2015  Glucose 65 - 99 mg/dL 78(G47(L)    Discharge instruction: per After Visit Summary and "Baby and Me Booklet".  After visit meds:  Allergies as of 11/27/2017   No Known Allergies     Medication List    TAKE these medications   BONJESTA PO Take 1 tablet by mouth daily.   calcium carbonate 500 MG chewable tablet Commonly known as:  TUMS - dosed in mg elemental calcium Chew 1 tablet by mouth 2 (two) times daily as needed for indigestion or heartburn.   multivitamin-prenatal 27-0.8 MG Tabs tablet Take 1 tablet by mouth daily at 12 noon.       Diet: routine diet  Activity: Advance as tolerated. Pelvic  rest for 6 weeks.   Outpatient follow up:6 weeks Follow up Appt: Future Appointments  Date Time Provider Department Center  01/07/2018  9:45 AM Anyanwu, Jethro Bastos, MD [facility]   Follow up Visit:No follow-ups on file.  Postpartum contraception: Vasectomy and Condoms  Newborn Data: Live born female  Birth Weight: 8 lb 6.6 oz (3816 g) APGAR: 4, 9  Newborn Delivery   Birth date/time:  11/25/2017 20:52:00 Delivery type:  Vaginal, Spontaneous     Baby Feeding: Breast Disposition:home with mother   11/27/2017 Tabitha Rose, CNM

## 2017-12-01 ENCOUNTER — Inpatient Hospital Stay (HOSPITAL_COMMUNITY): Payer: BLUE CROSS/BLUE SHIELD

## 2018-01-07 ENCOUNTER — Ambulatory Visit: Payer: BLUE CROSS/BLUE SHIELD | Admitting: Obstetrics & Gynecology

## 2018-01-08 ENCOUNTER — Ambulatory Visit (INDEPENDENT_AMBULATORY_CARE_PROVIDER_SITE_OTHER): Payer: BLUE CROSS/BLUE SHIELD | Admitting: Obstetrics & Gynecology

## 2018-01-08 ENCOUNTER — Encounter: Payer: Self-pay | Admitting: Obstetrics & Gynecology

## 2018-01-08 DIAGNOSIS — Z803 Family history of malignant neoplasm of breast: Secondary | ICD-10-CM | POA: Diagnosis not present

## 2018-01-08 DIAGNOSIS — Z1389 Encounter for screening for other disorder: Secondary | ICD-10-CM

## 2018-01-08 NOTE — Patient Instructions (Signed)
Return to clinic for any scheduled appointments or for any gynecologic concerns as needed.   

## 2018-01-08 NOTE — Progress Notes (Signed)
Post Partum Exam  Tabitha Rose is a 32 y.o. G11P2002 female who presents for a postpartum visit. She is 6 weeks postpartum following a spontaneous vaginal delivery. I have fully reviewed the prenatal and intrapartum course. The delivery was at 40 gestational weeks.  Anesthesia: epidural. Postpartum course has been uncomplicated. Baby's course has been uncomplicated. Baby is feeding by both breast and bottle - Similac Advance. Bleeding no bleeding. Bowel function is normal. Bladder function is normal. Patient is not sexually active. Contraception method is none. Postpartum depression screening:neg  The following portions of the patient's history were reviewed and updated as appropriate: allergies, current medications, past family history, past medical history, past social history, past surgical history and problem list. Last pap smear done 04/22/2017 and was normal and negative HRHPV  Review of Systems Pertinent items noted in HPI and remainder of comprehensive ROS otherwise negative.    Objective:  Blood pressure 118/66, pulse 83, height _0  (1.575 m), weight 148 lb 3.2 oz (67.2 kg), currently breastfeeding.  General:  alert and no distress   Breasts:  inspection negative, no nipple discharge or bleeding, no masses or nodularity palpable  Lungs: clear to auscultation bilaterally  Heart:  regular rate and rhythm  Abdomen: soft, non-tender; bowel sounds normal; no masses,  no organomegaly   Pelvic:  not evaluated        Assessment:   Normal postpartum exam. Pap smear not done at today's visit.   Plan:   1. Contraception: vasectomy  2. Patient history is remarkable for mother with breast cancer diagnosed at 77, negative BRCA testing for mother. Will order screening mammogram now given her current age of 36. 2. Follow up as needed.    Verita Schneiders, MD, [place] [facility] [employer], [place]

## 2018-02-05 ENCOUNTER — Ambulatory Visit
Admission: RE | Admit: 2018-02-05 | Discharge: 2018-02-05 | Disposition: A | Discharge status: Home / Self Care | Payer: BLUE CROSS/BLUE SHIELD | Source: Ambulatory Visit | Attending: Obstetrics & Gynecology | Admitting: Obstetrics & Gynecology

## 2018-02-05 DIAGNOSIS — Z803 Family history of malignant neoplasm of breast: Secondary | ICD-10-CM

## 2018-02-05 DIAGNOSIS — Z1231 Encounter for screening mammogram for malignant neoplasm of breast: Secondary | ICD-10-CM | POA: Diagnosis not present

## 2018-02-09 ENCOUNTER — Other Ambulatory Visit: Payer: Self-pay | Admitting: Obstetrics & Gynecology

## 2018-02-09 DIAGNOSIS — R928 Other abnormal and inconclusive findings on diagnostic imaging of breast: Secondary | ICD-10-CM

## 2018-02-12 ENCOUNTER — Ambulatory Visit
Admission: RE | Admit: 2018-02-12 | Discharge: 2018-02-12 | Disposition: A | Discharge status: Home / Self Care | Payer: BLUE CROSS/BLUE SHIELD | Source: Ambulatory Visit | Attending: Obstetrics & Gynecology | Admitting: Obstetrics & Gynecology

## 2018-02-12 ENCOUNTER — Other Ambulatory Visit: Payer: Self-pay | Admitting: Obstetrics & Gynecology

## 2018-02-12 DIAGNOSIS — N6489 Other specified disorders of breast: Secondary | ICD-10-CM

## 2018-02-12 DIAGNOSIS — R928 Other abnormal and inconclusive findings on diagnostic imaging of breast: Secondary | ICD-10-CM

## 2018-02-12 DIAGNOSIS — R922 Inconclusive mammogram: Secondary | ICD-10-CM | POA: Diagnosis not present

## 2018-03-10 NOTE — Telephone Encounter (Signed)
error 

## 2018-09-10 ENCOUNTER — Other Ambulatory Visit: Payer: Self-pay | Admitting: Obstetrics & Gynecology

## 2018-09-10 ENCOUNTER — Ambulatory Visit
Admission: RE | Admit: 2018-09-10 | Discharge: 2018-09-10 | Disposition: A | Discharge status: Home / Self Care | Payer: BLUE CROSS/BLUE SHIELD | Source: Ambulatory Visit | Attending: Obstetrics & Gynecology | Admitting: Obstetrics & Gynecology

## 2018-09-10 ENCOUNTER — Other Ambulatory Visit: Payer: Self-pay

## 2018-09-10 DIAGNOSIS — N6489 Other specified disorders of breast: Secondary | ICD-10-CM

## 2019-02-25 ENCOUNTER — Ambulatory Visit
Admission: RE | Admit: 2019-02-25 | Discharge: 2019-02-25 | Disposition: A | Discharge status: Home / Self Care | Payer: BC Managed Care – PPO | Source: Ambulatory Visit | Attending: Obstetrics & Gynecology | Admitting: Obstetrics & Gynecology

## 2019-02-25 ENCOUNTER — Other Ambulatory Visit: Payer: Self-pay

## 2019-02-25 DIAGNOSIS — N6489 Other specified disorders of breast: Secondary | ICD-10-CM

## 2019-02-25 DIAGNOSIS — R922 Inconclusive mammogram: Secondary | ICD-10-CM | POA: Diagnosis not present

## 2019-08-10 DIAGNOSIS — G43709 Chronic migraine without aura, not intractable, without status migrainosus: Secondary | ICD-10-CM | POA: Diagnosis not present

## 2019-08-24 ENCOUNTER — Ambulatory Visit: Payer: BC Managed Care – PPO | Attending: Internal Medicine

## 2019-08-24 DIAGNOSIS — Z20822 Contact with and (suspected) exposure to covid-19: Secondary | ICD-10-CM | POA: Diagnosis not present

## 2019-08-25 LAB — SARS-COV-2, NAA 2 DAY TAT

## 2019-08-25 LAB — NOVEL CORONAVIRUS, NAA: SARS-CoV-2, NAA: NOT DETECTED

## 2019-09-01 IMAGING — MG DIGITAL DIAGNOSTIC BILATERAL MAMMOGRAM WITH TOMO AND CAD
6 of 9 series · 6 of 25 positions shown · non-contrast
Comparison: Baseline screening mammogram 02/05/2018

CLINICAL DATA: Screening recall for bilateral breast asymmetries.

EXAM:
DIGITAL DIAGNOSTIC BILATERAL MAMMOGRAM WITH CAD AND TOMO
ULTRASOUND BILATERAL BREAST

[R CC synth-2D]
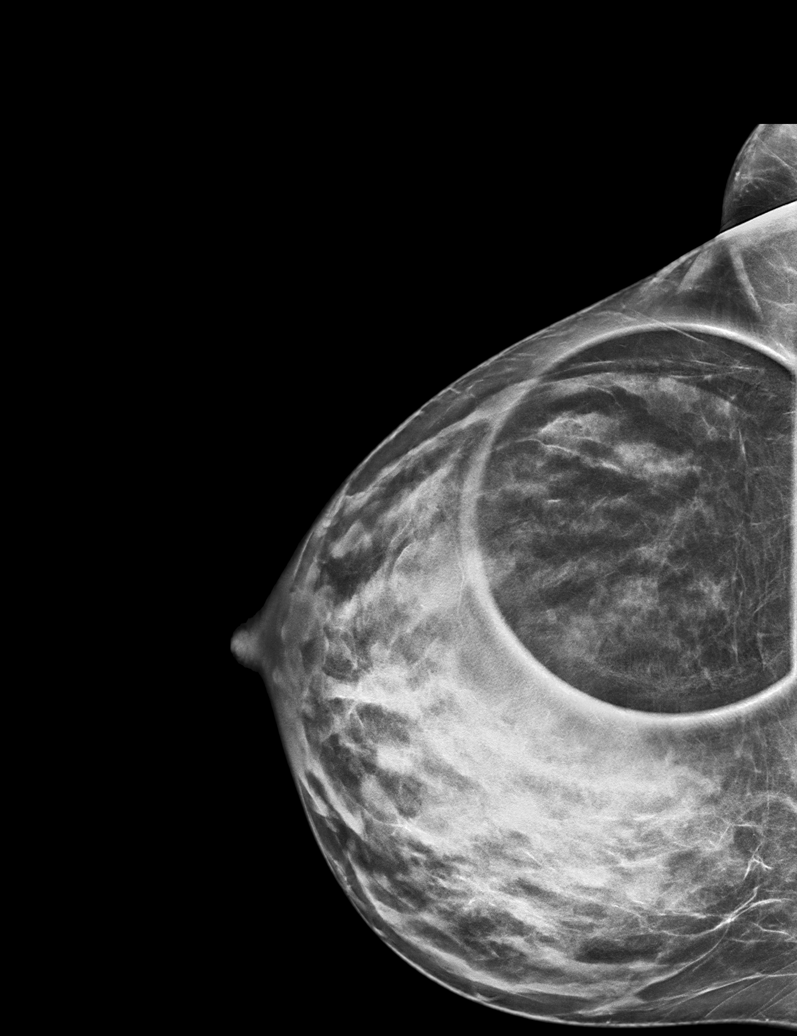

[L ML synth-2D]
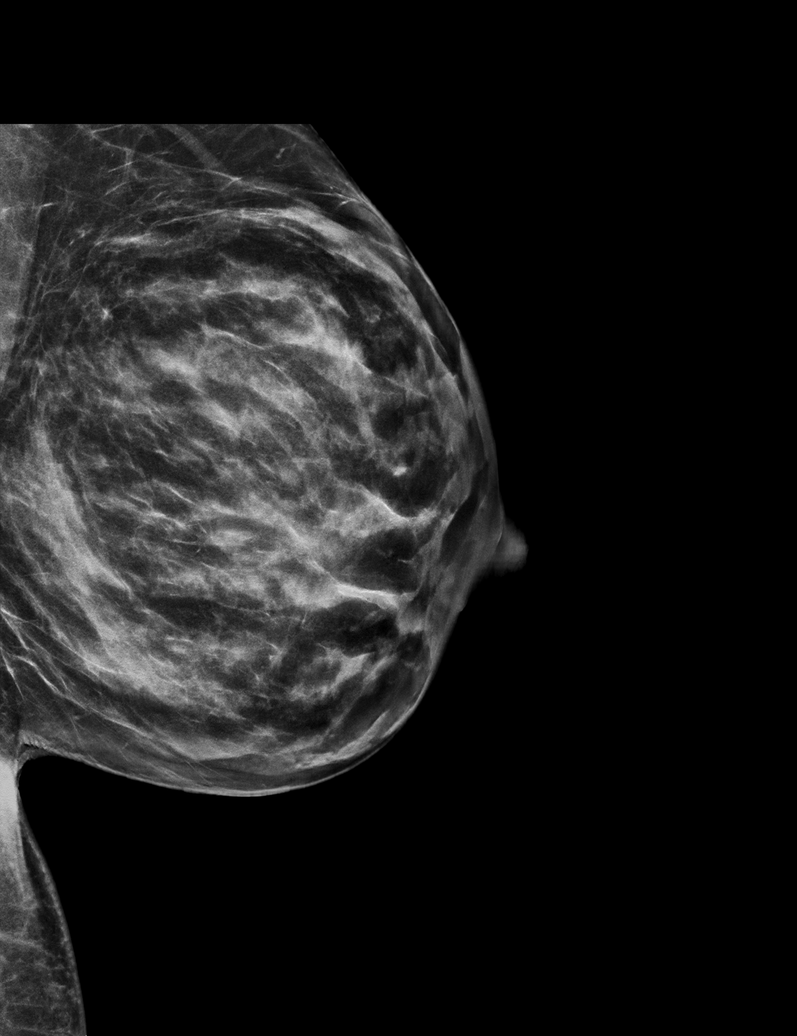

[R MLO synth-2D (1 of 2)]
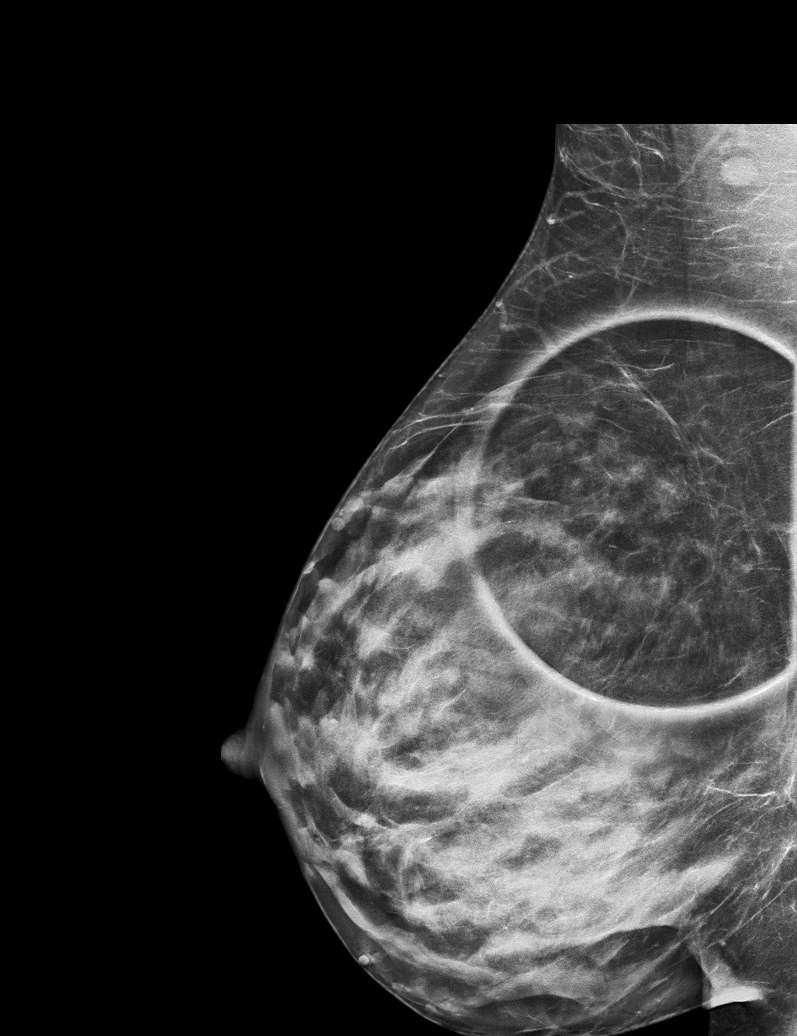

[R MLO synth-2D (2 of 2)]
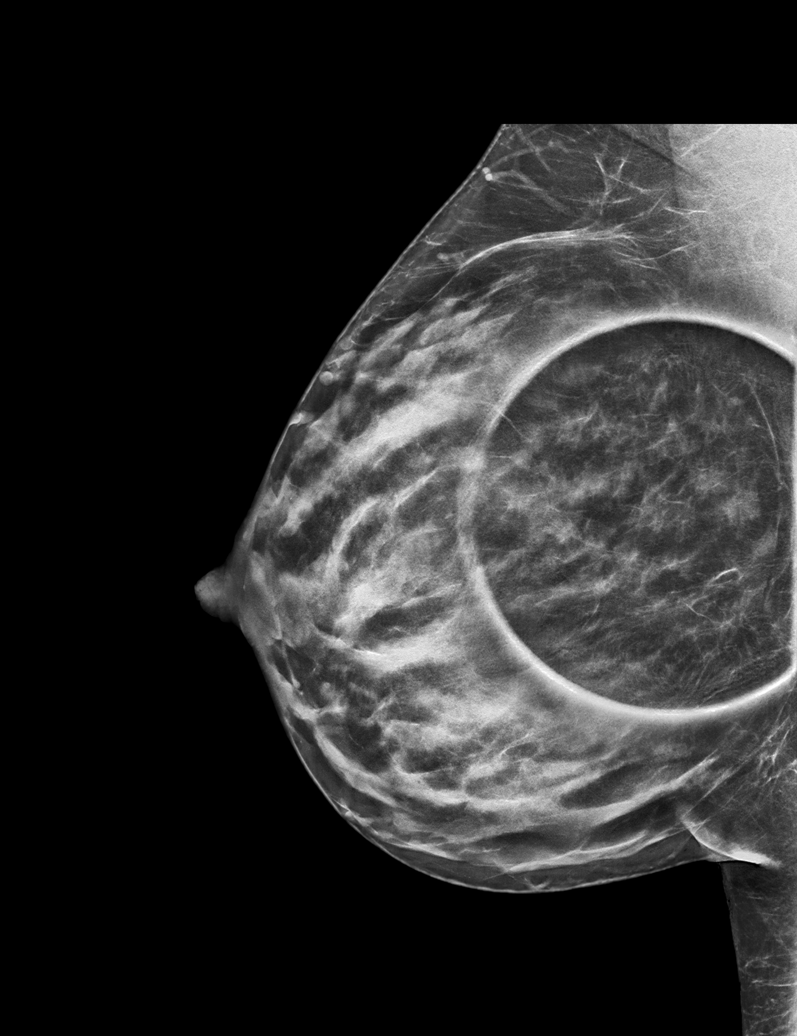

[R ML synth-2D]
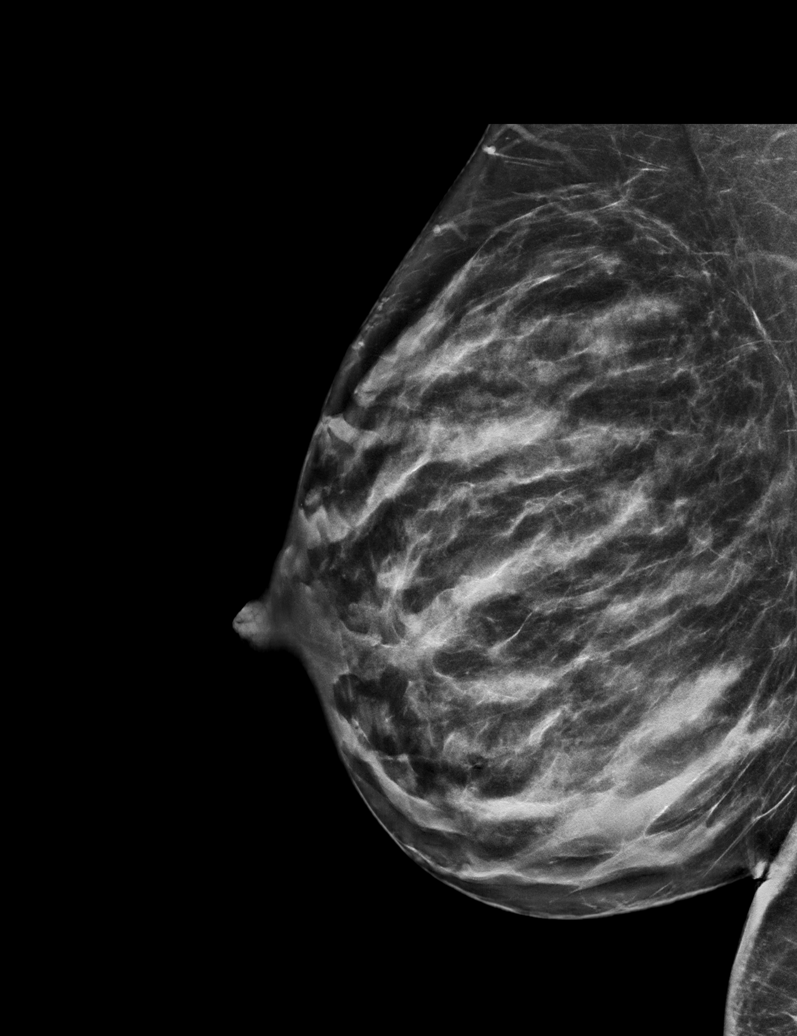

[R CC tomo · tomo slice 32/63.0]
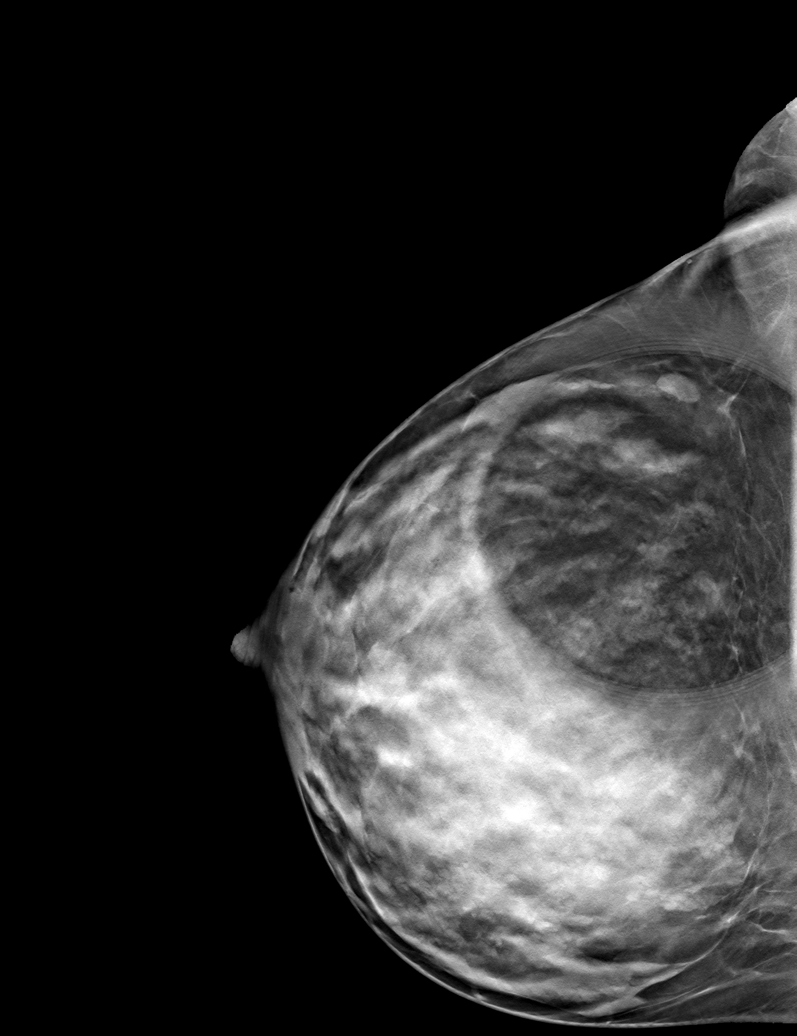

[6 of 25 positions shown; findings below may reference images not displayed]

ACR Breast Density Category c: The breast tissue is heterogeneously
dense, which may obscure small masses.
FINDINGS: There is a persistent asymmetry in the lateral far posterior right
breast which measures approximately 7 mm. No definite correlate is
seen on the CC view. A larger asymmetry is seen in the lateral
aspect of the left breast on the CC view only. This spans
approximately 3.5 cm.

Mammographic images were processed with CAD.

On physical exam, no palpable masses are identified in the lateral
right breast or in the lateral left breast.

Ultrasound of the right breast between 8 and 10 o'clock demonstrates
normal fibroglandular tissue.

Ultrasound of the left breast between 2 and 4 o'clock demonstrates
normal fibroglandular tissue.
IMPRESSION: 1. There are persistent bilateral breast asymmetries without
sonographic correlate. This is a probably benign finding.

RECOMMENDATION:
Six-month follow-up bilateral diagnostic mammogram is recommended.

I have discussed the findings and recommendations with the patient.
Results were also provided in writing at the conclusion of the
visit. If applicable, a reminder letter will be sent to the patient
regarding the next appointment.

BI-RADS CATEGORY  3: Probably benign.

## 2019-09-09 ENCOUNTER — Ambulatory Visit: Payer: BC Managed Care – PPO | Attending: Internal Medicine

## 2019-09-09 DIAGNOSIS — Z23 Encounter for immunization: Secondary | ICD-10-CM

## 2019-09-09 NOTE — Progress Notes (Signed)
   Covid-19 Vaccination Clinic  Name:  Tabitha Rose    MRN: [id number] DOB: 05/09/1986  09/09/2019  [name] was observed post Covid-19 immunization for 15 minutes without incident. She was provided with Vaccine Information Sheet and instruction to access the V-Safe system.   [name] was instructed to call 911 with any severe reactions post vaccine: Marland Kitchen Difficulty breathing  . Swelling of face and throat  . A fast heartbeat  . A bad rash all over body  . Dizziness and weakness   Immunizations Administered    Name Date Dose VIS Date Route   Pfizer COVID-19 Vaccine 09/09/2019  9:33 AM 0.3 mL 05/13/2019 Intramuscular   Manufacturer: [employer], [employer]   Lot: [id number]   NDC: [address]-0

## 2019-10-03 ENCOUNTER — Ambulatory Visit: Payer: BC Managed Care – PPO | Attending: Internal Medicine

## 2019-10-03 DIAGNOSIS — Z23 Encounter for immunization: Secondary | ICD-10-CM

## 2019-10-03 NOTE — Progress Notes (Signed)
   Covid-19 Vaccination Clinic  Name:  Tabitha Rose    MRN: [id number] DOB: May 02, 1986  10/03/2019  [name] was observed post Covid-19 immunization for 15 minutes without incident. She was provided with Vaccine Information Sheet and instruction to access the V-Safe system.   [name] was instructed to call 911 with any severe reactions post vaccine: Marland Kitchen Difficulty breathing  . Swelling of face and throat  . A fast heartbeat  . A bad rash all over body  . Dizziness and weakness   Immunizations Administered    Name Date Dose VIS Date Route   Pfizer COVID-19 Vaccine 10/03/2019 10:39 AM 0.3 mL 07/27/2018 Intramuscular   Manufacturer: [employer], [employer]   Lot: [id number]   NDC: [address]-4

## 2019-11-08 ENCOUNTER — Encounter: Payer: Self-pay | Admitting: Family Medicine

## 2019-11-08 ENCOUNTER — Other Ambulatory Visit: Payer: Self-pay

## 2019-11-08 ENCOUNTER — Ambulatory Visit (INDEPENDENT_AMBULATORY_CARE_PROVIDER_SITE_OTHER): Payer: BC Managed Care – PPO | Admitting: Family Medicine

## 2019-11-08 ENCOUNTER — Other Ambulatory Visit (HOSPITAL_COMMUNITY)
Admission: RE | Admit: 2019-11-08 | Discharge: 2019-11-08 | Disposition: A | Discharge status: Home / Self Care | Payer: BC Managed Care – PPO | Source: Ambulatory Visit | Attending: Family Medicine | Admitting: Family Medicine

## 2019-11-08 VITALS — BP 121/80 | HR 76 | Wt 119.6 lb

## 2019-11-08 DIAGNOSIS — N898 Other specified noninflammatory disorders of vagina: Secondary | ICD-10-CM

## 2019-11-08 DIAGNOSIS — Z01419 Encounter for gynecological examination (general) (routine) without abnormal findings: Secondary | ICD-10-CM

## 2019-11-08 DIAGNOSIS — Z124 Encounter for screening for malignant neoplasm of cervix: Secondary | ICD-10-CM | POA: Diagnosis not present

## 2019-11-08 DIAGNOSIS — Z1272 Encounter for screening for malignant neoplasm of vagina: Secondary | ICD-10-CM | POA: Diagnosis not present

## 2019-11-08 DIAGNOSIS — Z803 Family history of malignant neoplasm of breast: Secondary | ICD-10-CM | POA: Diagnosis not present

## 2019-11-08 NOTE — Progress Notes (Signed)
Patient reports having abnormal cycle for about 1-2 months.   Last Pap 04/22/2017- normal   UTD- HPV  NO STI testing

## 2019-11-08 NOTE — Assessment & Plan Note (Signed)
Continue to observe as removal would be difficult

## 2019-11-08 NOTE — Assessment & Plan Note (Signed)
Yearly mammogram in 9/21

## 2019-11-08 NOTE — Patient Instructions (Signed)
 Preventive Care 21-35 Years Old, Female Preventive care refers to visits with your health care provider and lifestyle choices that can promote health and wellness. This includes:  A yearly physical exam. This may also be called an annual well check.  Regular dental visits and eye exams.  Immunizations.  Screening for certain conditions.  Healthy lifestyle choices, such as eating a healthy diet, getting regular exercise, not using drugs or products that contain nicotine and tobacco, and limiting alcohol use. What can I expect for my preventive care visit? Physical exam Your health care provider will check your:  Height and weight. This may be used to calculate body mass index (BMI), which tells if you are at a healthy weight.  Heart rate and blood pressure.  Skin for abnormal spots. Counseling Your health care provider may ask you questions about your:  Alcohol, tobacco, and drug use.  Emotional well-being.  Home and relationship well-being.  Sexual activity.  Eating habits.  Work and work environment.  Method of birth control.  Menstrual cycle.  Pregnancy history. What immunizations do I need?  Influenza (flu) vaccine  This is recommended every year. Tetanus, diphtheria, and pertussis (Tdap) vaccine  You may need a Td booster every 10 years. Varicella (chickenpox) vaccine  You may need this if you have not been vaccinated. Human papillomavirus (HPV) vaccine  If recommended by your health care provider, you may need three doses over 6 months. Measles, mumps, and rubella (MMR) vaccine  You may need at least one dose of MMR. You may also need a second dose. Meningococcal conjugate (MenACWY) vaccine  One dose is recommended if you are age 19-21 years and a first-year college student living in a residence hall, or if you have one of several medical conditions. You may also need additional booster doses. Pneumococcal conjugate (PCV13) vaccine  You may need  this if you have certain conditions and were not previously vaccinated. Pneumococcal polysaccharide (PPSV23) vaccine  You may need one or two doses if you smoke cigarettes or if you have certain conditions. Hepatitis A vaccine  You may need this if you have certain conditions or if you travel or work in places where you may be exposed to hepatitis A. Hepatitis B vaccine  You may need this if you have certain conditions or if you travel or work in places where you may be exposed to hepatitis B. Haemophilus influenzae type b (Hib) vaccine  You may need this if you have certain conditions. You may receive vaccines as individual doses or as more than one vaccine together in one shot (combination vaccines). Talk with your health care provider about the risks and benefits of combination vaccines. What tests do I need?  Blood tests  Lipid and cholesterol levels. These may be checked every 5 years starting at age 20.  Hepatitis C test.  Hepatitis B test. Screening  Diabetes screening. This is done by checking your blood sugar (glucose) after you have not eaten for a while (fasting).  Sexually transmitted disease (STD) testing.  BRCA-related cancer screening. This may be done if you have a family history of breast, ovarian, tubal, or peritoneal cancers.  Pelvic exam and Pap test. This may be done every 3 years starting at age 21. Starting at age 30, this may be done every 5 years if you have a Pap test in combination with an HPV test. Talk with your health care provider about your test results, treatment options, and if necessary, the need for more   tests. Follow these instructions at home: Eating and drinking   Eat a diet that includes fresh fruits and vegetables, whole grains, lean protein, and low-fat dairy.  Take vitamin and mineral supplements as recommended by your health care provider.  Do not drink alcohol if: ? Your health care provider tells you not to drink. ? You are  pregnant, may be pregnant, or are planning to become pregnant.  If you drink alcohol: ? Limit how much you have to 0-1 drink a day. ? Be aware of how much alcohol is in your drink. In the [place] one drink equals one 12 oz bottle of beer (355 mL), one 5 oz glass of wine (148 mL), or one 1 oz glass of hard liquor (44 mL). Lifestyle  Take daily care of your teeth and gums.  Stay active. Exercise for at least 30 minutes on 5 or more days each week.  Do not use any products that contain nicotine or tobacco, such as cigarettes, e-cigarettes, and chewing tobacco. If you need help quitting, ask your health care provider.  If you are sexually active, practice safe sex. Use a condom or other form of birth control (contraception) in order to prevent pregnancy and STIs (sexually transmitted infections). If you plan to become pregnant, see your health care provider for a preconception visit. What's next?  Visit your health care provider once a year for a well check visit.  Ask your health care provider how often you should have your eyes and teeth checked.  Stay up to date on all vaccines. This information is not intended to replace advice given to you by your health care provider. Make sure you discuss any questions you have with your health care provider. Document Revised: 01/28/2018 Document Reviewed: 01/28/2018 Elsevier Patient Education  2020 Elsevier Inc.  

## 2019-11-08 NOTE — Progress Notes (Signed)
  Subjective:     Tabitha Rose is a 34 y.o. female and is here for a comprehensive physical exam. The patient reports problems - irregular cycles with a lot of cramping following COVID vaccine. Since September, has noted firmness on the vaginal wall which has not changed. Feels when changing her menstrual cup. Volunteering for now. Husband planning vasectomy.   The following portions of the patient's history were reviewed and updated as appropriate: allergies, current medications, past family history, past medical history, past social history, past surgical history and problem list.  Review of Systems Pertinent items noted in HPI and remainder of comprehensive ROS otherwise negative.   Objective:    BP 121/80   Pulse 76   Wt 119 lb 9.6 oz (54.3 kg)   LMP 11/08/2019   BMI 21.88 kg/m  General appearance: alert, cooperative and appears stated age Head: Normocephalic, without obvious abnormality, atraumatic Neck: no adenopathy, supple, symmetrical, trachea midline and thyroid not enlarged, symmetric, no tenderness/mass/nodules Lungs: clear to auscultation bilaterally Breasts: normal appearance, no masses or tenderness Heart: regular rate and rhythm, S1, S2 normal, no murmur, click, rub or gallop Abdomen: soft, non-tender; bowel sounds normal; no masses,  no organomegaly Pelvic: cervix normal in appearance, external genitalia normal, no adnexal masses or tenderness, no cervical motion tenderness, uterus normal size, shape, and consistency, vagina normal without discharge and small firm mobile oval noted on right vaginal wall Extremities: extremities normal, atraumatic, no cyanosis or edema Pulses: 2+ and symmetric Skin: Skin color, texture, turgor normal. No rashes or lesions Lymph nodes: Cervical, supraclavicular, and axillary nodes normal. Neurologic: Grossly normal    Assessment:    Healthy female exam.      Plan:   Problem List Items Addressed This Visit      Unprioritized   FH: breast cancer in Mother at age 71 - Primary    Yearly mammogram in 9/21       Relevant Orders   MM 3D SCREEN BREAST BILATERAL   Vaginal wall cyst    Continue to observe as removal would be difficult       Other Visit Diagnoses    Screening for malignant neoplasm of cervix       Relevant Orders   Cytology - PAP( [facility])   Encounter for gynecological examination without abnormal finding         Return in 1 year (on 11/07/2020).    See After Visit Summary for Counseling Recommendations

## 2019-11-09 LAB — CYTOLOGY - PAP
Comment: NEGATIVE
Diagnosis: NEGATIVE
High risk HPV: NEGATIVE

## 2020-01-06 DIAGNOSIS — Z1159 Encounter for screening for other viral diseases: Secondary | ICD-10-CM | POA: Diagnosis not present

## 2020-01-06 DIAGNOSIS — M21621 Bunionette of right foot: Secondary | ICD-10-CM | POA: Diagnosis not present

## 2020-01-06 DIAGNOSIS — F4322 Adjustment disorder with anxiety: Secondary | ICD-10-CM | POA: Diagnosis not present

## 2020-01-06 DIAGNOSIS — E7800 Pure hypercholesterolemia, unspecified: Secondary | ICD-10-CM | POA: Diagnosis not present

## 2020-01-06 DIAGNOSIS — Z13220 Encounter for screening for lipoid disorders: Secondary | ICD-10-CM | POA: Diagnosis not present

## 2020-01-06 DIAGNOSIS — Z0000 Encounter for general adult medical examination without abnormal findings: Secondary | ICD-10-CM | POA: Diagnosis not present

## 2020-01-06 DIAGNOSIS — R7989 Other specified abnormal findings of blood chemistry: Secondary | ICD-10-CM | POA: Diagnosis not present

## 2020-01-06 DIAGNOSIS — Z0001 Encounter for general adult medical examination with abnormal findings: Secondary | ICD-10-CM | POA: Diagnosis not present

## 2020-01-10 DIAGNOSIS — F4320 Adjustment disorder, unspecified: Secondary | ICD-10-CM | POA: Diagnosis not present

## 2020-02-02 DIAGNOSIS — F4320 Adjustment disorder, unspecified: Secondary | ICD-10-CM | POA: Diagnosis not present

## 2020-02-09 DIAGNOSIS — F4322 Adjustment disorder with anxiety: Secondary | ICD-10-CM | POA: Diagnosis not present

## 2020-02-21 DIAGNOSIS — M21621 Bunionette of right foot: Secondary | ICD-10-CM | POA: Diagnosis not present

## 2020-05-10 DIAGNOSIS — R7989 Other specified abnormal findings of blood chemistry: Secondary | ICD-10-CM | POA: Diagnosis not present

## 2020-05-10 DIAGNOSIS — F4322 Adjustment disorder with anxiety: Secondary | ICD-10-CM | POA: Diagnosis not present

## 2020-09-26 ENCOUNTER — Encounter: Payer: Self-pay | Admitting: Radiology
